# Patient Record
Sex: Female | Born: 1945 | Race: White | Hispanic: No | Marital: Single | Smoking: Never smoker
Health system: Southern US, Community
[De-identification: ages and names within clinical notes are randomized; demographics above are authoritative.]

## PROBLEM LIST (undated history)

## (undated) DIAGNOSIS — N289 Disorder of kidney and ureter, unspecified: Secondary | ICD-10-CM

## (undated) DIAGNOSIS — I10 Essential (primary) hypertension: Secondary | ICD-10-CM

## (undated) DIAGNOSIS — F0390 Unspecified dementia without behavioral disturbance: Secondary | ICD-10-CM

---

## 2005-09-04 ENCOUNTER — Ambulatory Visit: Payer: Self-pay | Admitting: Family Medicine

## 2015-10-07 ENCOUNTER — Encounter (HOSPITAL_COMMUNITY): Payer: Self-pay | Admitting: *Deleted

## 2015-10-07 ENCOUNTER — Inpatient Hospital Stay (HOSPITAL_COMMUNITY)
Admission: EM | Admit: 2015-10-07 | Discharge: 2015-10-15 | DRG: 071 | Disposition: A | Discharge status: Nursing Home (Includes ICF) | Payer: Medicare Other | Attending: Internal Medicine | Admitting: Internal Medicine

## 2015-10-07 ENCOUNTER — Emergency Department (HOSPITAL_COMMUNITY): Payer: Medicare Other

## 2015-10-07 DIAGNOSIS — N179 Acute kidney failure, unspecified: Secondary | ICD-10-CM | POA: Diagnosis present

## 2015-10-07 DIAGNOSIS — R1312 Dysphagia, oropharyngeal phase: Secondary | ICD-10-CM

## 2015-10-07 DIAGNOSIS — I10 Essential (primary) hypertension: Secondary | ICD-10-CM | POA: Diagnosis present

## 2015-10-07 DIAGNOSIS — G9340 Encephalopathy, unspecified: Secondary | ICD-10-CM | POA: Diagnosis not present

## 2015-10-07 DIAGNOSIS — R2689 Other abnormalities of gait and mobility: Secondary | ICD-10-CM

## 2015-10-07 DIAGNOSIS — Z820 Family history of epilepsy and other diseases of the nervous system: Secondary | ICD-10-CM

## 2015-10-07 DIAGNOSIS — L409 Psoriasis, unspecified: Secondary | ICD-10-CM | POA: Diagnosis present

## 2015-10-07 DIAGNOSIS — N184 Chronic kidney disease, stage 4 (severe): Secondary | ICD-10-CM | POA: Diagnosis present

## 2015-10-07 DIAGNOSIS — I129 Hypertensive chronic kidney disease with stage 1 through stage 4 chronic kidney disease, or unspecified chronic kidney disease: Secondary | ICD-10-CM | POA: Diagnosis present

## 2015-10-07 DIAGNOSIS — G9349 Other encephalopathy: Principal | ICD-10-CM | POA: Diagnosis present

## 2015-10-07 DIAGNOSIS — R482 Apraxia: Secondary | ICD-10-CM

## 2015-10-07 DIAGNOSIS — Z66 Do not resuscitate: Secondary | ICD-10-CM | POA: Diagnosis present

## 2015-10-07 DIAGNOSIS — I160 Hypertensive urgency: Secondary | ICD-10-CM | POA: Diagnosis present

## 2015-10-07 DIAGNOSIS — E871 Hypo-osmolality and hyponatremia: Secondary | ICD-10-CM | POA: Diagnosis present

## 2015-10-07 DIAGNOSIS — F0150 Vascular dementia without behavioral disturbance: Secondary | ICD-10-CM | POA: Diagnosis present

## 2015-10-07 DIAGNOSIS — R4182 Altered mental status, unspecified: Secondary | ICD-10-CM | POA: Diagnosis not present

## 2015-10-07 DIAGNOSIS — F0390 Unspecified dementia without behavioral disturbance: Secondary | ICD-10-CM | POA: Diagnosis present

## 2015-10-07 DIAGNOSIS — R7989 Other specified abnormal findings of blood chemistry: Secondary | ICD-10-CM

## 2015-10-07 HISTORY — DX: Unspecified dementia, unspecified severity, without behavioral disturbance, psychotic disturbance, mood disturbance, and anxiety: F03.90

## 2015-10-07 HISTORY — DX: Essential (primary) hypertension: I10

## 2015-10-07 HISTORY — DX: Disorder of kidney and ureter, unspecified: N28.9

## 2015-10-07 LAB — COMPREHENSIVE METABOLIC PANEL
ALBUMIN: 3.5 g/dL (ref 3.5–5.0)
ALK PHOS: 43 U/L (ref 38–126)
ALT: 19 U/L (ref 14–54)
ANION GAP: 10 (ref 5–15)
AST: 19 U/L (ref 15–41)
BILIRUBIN TOTAL: 0.8 mg/dL (ref 0.3–1.2)
BUN: 43 mg/dL — AB (ref 6–20)
CALCIUM: 9.6 mg/dL (ref 8.9–10.3)
CO2: 24 mmol/L (ref 22–32)
Chloride: 99 mmol/L — ABNORMAL LOW (ref 101–111)
Creatinine, Ser: 2.89 mg/dL — ABNORMAL HIGH (ref 0.44–1.00)
GFR calc Af Amer: 18 mL/min — ABNORMAL LOW (ref >60)
GFR calc non Af Amer: 15 mL/min — ABNORMAL LOW (ref >60)
GLUCOSE: 91 mg/dL (ref 65–99)
POTASSIUM: 3.9 mmol/L (ref 3.5–5.1)
SODIUM: 133 mmol/L — AB (ref 135–145)
TOTAL PROTEIN: 6.7 g/dL (ref 6.5–8.1)

## 2015-10-07 LAB — CBC
HEMATOCRIT: 32.1 % — AB (ref 36.0–46.0)
HEMOGLOBIN: 10.9 g/dL — AB (ref 12.0–15.0)
MCH: 29.9 pg (ref 26.0–34.0)
MCHC: 34 g/dL (ref 30.0–36.0)
MCV: 88.2 fL (ref 78.0–100.0)
Platelets: 239 10*3/uL (ref 150–400)
RBC: 3.64 MIL/uL — ABNORMAL LOW (ref 3.87–5.11)
RDW: 13.3 % (ref 11.5–15.5)
WBC: 5.7 10*3/uL (ref 4.0–10.5)

## 2015-10-07 LAB — URINALYSIS, ROUTINE W REFLEX MICROSCOPIC
BILIRUBIN URINE: NEGATIVE
GLUCOSE, UA: NEGATIVE mg/dL
HGB URINE DIPSTICK: NEGATIVE
Ketones, ur: NEGATIVE mg/dL
Leukocytes, UA: NEGATIVE
Nitrite: NEGATIVE
Protein, ur: >300 mg/dL — AB
SPECIFIC GRAVITY, URINE: 1.017 (ref 1.005–1.030)
pH: 6 (ref 5.0–8.0)

## 2015-10-07 LAB — URINE MICROSCOPIC-ADD ON: RBC / HPF: NONE SEEN RBC/hpf (ref 0–5)

## 2015-10-07 LAB — CBG MONITORING, ED: Glucose-Capillary: 85 mg/dL (ref 65–99)

## 2015-10-07 MED ORDER — SODIUM CHLORIDE 0.9 % IV BOLUS (SEPSIS)
1000.0000 mL | Freq: Once | INTRAVENOUS | Status: AC
  Administered 2015-10-07: 1000 mL via INTRAVENOUS

## 2015-10-07 MED ORDER — ORAL CARE MOUTH RINSE
15.0000 mL | Freq: Two times a day (BID) | OROMUCOSAL | Status: DC
  Administered 2015-10-10 – 2015-10-14 (×7): 15 mL via OROMUCOSAL

## 2015-10-07 MED ORDER — HYDRALAZINE HCL 20 MG/ML IJ SOLN: 10.0000 mg | INTRAMUSCULAR | Status: DC | PRN

## 2015-10-07 MED ORDER — ADULT MULTIVITAMIN W/MINERALS CH
1.0000 | ORAL_TABLET | Freq: Every day | ORAL | Status: DC
  Administered 2015-10-08 – 2015-10-15 (×8): 1 via ORAL
  Filled 2015-10-07 (×8): qty 1

## 2015-10-07 MED ORDER — KETOCONAZOLE 2 % EX SHAM
1.0000 "application " | MEDICATED_SHAMPOO | CUTANEOUS | Status: DC
  Administered 2015-10-09 – 2015-10-11 (×2): 1 via TOPICAL
  Filled 2015-10-07: qty 120

## 2015-10-07 MED ORDER — TRIAMCINOLONE ACETONIDE 0.1 % EX CREA
1.0000 "application " | TOPICAL_CREAM | CUTANEOUS | Status: DC
  Administered 2015-10-09 – 2015-10-13 (×2): 1 via TOPICAL
  Filled 2015-10-07: qty 15

## 2015-10-07 MED ORDER — BUPROPION HCL ER (XL) 150 MG PO TB24
150.0000 mg | ORAL_TABLET | Freq: Every day | ORAL | Status: DC
  Administered 2015-10-08 – 2015-10-15 (×8): 150 mg via ORAL
  Filled 2015-10-07 (×8): qty 1

## 2015-10-07 MED ORDER — ASPIRIN 325 MG PO TABS
325.0000 mg | ORAL_TABLET | Freq: Every day | ORAL | Status: DC
  Administered 2015-10-08 – 2015-10-15 (×8): 325 mg via ORAL
  Filled 2015-10-07 (×8): qty 1

## 2015-10-07 MED ORDER — POLYETHYLENE GLYCOL 3350 17 G PO PACK
17.0000 g | PACK | Freq: Every day | ORAL | Status: DC
  Administered 2015-10-08 – 2015-10-15 (×6): 17 g via ORAL
  Filled 2015-10-07 (×8): qty 1

## 2015-10-07 MED ORDER — PERPHENAZINE 4 MG PO TABS
4.0000 mg | ORAL_TABLET | Freq: Two times a day (BID) | ORAL | Status: DC
  Administered 2015-10-08 – 2015-10-15 (×15): 4 mg via ORAL
  Filled 2015-10-07 (×17): qty 1

## 2015-10-07 MED ORDER — HYDRALAZINE HCL 25 MG PO TABS
25.0000 mg | ORAL_TABLET | Freq: Three times a day (TID) | ORAL | Status: DC
  Administered 2015-10-08 (×2): 25 mg via ORAL
  Filled 2015-10-07 (×2): qty 1

## 2015-10-07 MED ORDER — HEPARIN SODIUM (PORCINE) 5000 UNIT/ML IJ SOLN
5000.0000 [IU] | Freq: Three times a day (TID) | INTRAMUSCULAR | Status: DC
  Administered 2015-10-08 – 2015-10-15 (×24): 5000 [IU] via SUBCUTANEOUS
  Filled 2015-10-07 (×21): qty 1

## 2015-10-07 MED ORDER — GUAIFENESIN 100 MG/5ML PO SOLN: 200.0000 mg | Freq: Four times a day (QID) | ORAL | Status: DC | PRN

## 2015-10-07 MED ORDER — ASPIRIN 300 MG RE SUPP
300.0000 mg | Freq: Every day | RECTAL | Status: DC
  Filled 2015-10-07 (×8): qty 1

## 2015-10-07 MED ORDER — CALCIUM CARBONATE-VITAMIN D 500-200 MG-UNIT PO TABS
1.0000 | ORAL_TABLET | Freq: Two times a day (BID) | ORAL | Status: DC
  Administered 2015-10-08 – 2015-10-15 (×16): 1 via ORAL
  Filled 2015-10-07 (×16): qty 1

## 2015-10-07 MED ORDER — SODIUM CHLORIDE 0.9 % IV SOLN
INTRAVENOUS | Status: DC
  Administered 2015-10-08: 75 mL/h via INTRAVENOUS
  Administered 2015-10-08 – 2015-10-09 (×2): via INTRAVENOUS

## 2015-10-07 MED ORDER — CHLORHEXIDINE GLUCONATE 0.12 % MT SOLN
15.0000 mL | Freq: Two times a day (BID) | OROMUCOSAL | Status: DC
  Administered 2015-10-08 – 2015-10-14 (×14): 15 mL via OROMUCOSAL
  Filled 2015-10-07 (×16): qty 15

## 2015-10-07 NOTE — ED Triage Notes (Signed)
EMS reports pt has had AMS for last 3 days [facility] on [address] Nsg wanted to wait to see if she cleared up, she did not, HTN 166/p P 100 CBG 86.

## 2015-10-07 NOTE — ED Provider Notes (Signed)
[facility]-EMERGENCY DEPT Provider Note   CSN: [id number] Arrival date & time: 10/07/15  1746     History   Chief Complaint Chief Complaint  Patient presents with  . Altered Mental Status    HPI Julie Salinas is a 70 y.o. female.  The history is provided by the nursing home. History limited by: patient's speech difficulty.  Altered Mental Status   This is a new problem. Episode onset: 3 days ago. The problem has been gradually worsening. Associated symptoms comments: Difficulty initiating speech or responding to questions, subtle change in activity level and interactiveness at facility. Risk factors: nursing home patient. Her past medical history is significant for hypertension.    Past Medical History:  Diagnosis Date  . Dementia   . Hypertension   . Renal disorder     There are no active problems to display for this patient.   History reviewed. No pertinent surgical history.  OB History    No data available       Home Medications    Prior to Admission medications   Medication Sig Start Date End Date Taking? Authorizing Provider  acetaminophen (TYLENOL) 500 MG tablet Take 500 mg by mouth every 4 (four) hours as needed for mild pain, moderate pain, fever or headache.   Yes Historical Provider, MD  alum & mag hydroxide-simeth (MAALOX/MYLANTA) 200-200-20 MG/5ML suspension Take 30 mLs by mouth every 6 (six) hours as needed for indigestion or heartburn.   Yes Historical Provider, MD  buPROPion (WELLBUTRIN XL) 150 MG 24 hr tablet Take 150 mg by mouth daily.   Yes Historical Provider, MD  calcium-vitamin D (OSCAL WITH D) 500-200 MG-UNIT tablet Take 1 tablet by mouth 2 (two) times daily with a meal.   Yes Historical Provider, MD  guaifenesin (ROBITUSSIN) 100 MG/5ML syrup Take 200 mg by mouth every 6 (six) hours as needed for cough.   Yes Historical Provider, MD  hydrALAZINE (APRESOLINE) 25 MG tablet Take 25 mg by mouth 3 (three) times daily.   Yes Historical Provider, MD    ketoconazole (NIZORAL) 2 % shampoo Apply 1 application topically 2 (two) times a week. Pt applies to scalp on Tuesday and Thursday.   Yes Historical Provider, MD  loperamide (IMODIUM) 2 MG capsule Take 2 mg by mouth as needed for diarrhea or loose stools.   Yes Historical Provider, MD  magnesium hydroxide (MILK OF MAGNESIA) 400 MG/5ML suspension Take 30 mLs by mouth at bedtime as needed for mild constipation.   Yes Historical Provider, MD  Multiple Vitamin (MULTIVITAMIN WITH MINERALS) TABS tablet Take 1 tablet by mouth daily.   Yes Historical Provider, MD  neomycin-bacitracin-polymyxin (NEOSPORIN) ointment Apply 1 application topically as needed for wound care. apply to eye   Yes Historical Provider, MD  perphenazine (TRILAFON) 4 MG tablet Take 4 mg by mouth 2 (two) times daily.   Yes Historical Provider, MD  polyethylene glycol (MIRALAX / GLYCOLAX) packet Take 17 g by mouth daily.   Yes Historical Provider, MD  triamcinolone cream (KENALOG) 0.1 % Apply 1 application topically 2 (two) times a week. Pt applies every Tuesday and Saturday.   Yes Historical Provider, MD    Family History No family history on file.  Social History Social History  Substance Use Topics  . Smoking status: Unknown If Ever Smoked  . Smokeless tobacco: Not on file  . Alcohol use No     Allergies   Review of patient's allergies indicates no known allergies.   Review of Systems Review  of Systems  All other systems reviewed and are negative.    Physical Exam Updated Vital Signs BP (!) 169/102 (BP Location: Right Arm)   Pulse 98   Temp 97.8 F (36.6 C) (Oral)   Resp 18   SpO2 97%   Physical Exam  Constitutional: She is oriented to person, place, and time. She appears well-developed and well-nourished. No distress.  HENT:  Head: Normocephalic.  Eyes: Conjunctivae are normal.  Neck: Neck supple. No tracheal deviation present.  Cardiovascular: Normal rate and regular rhythm.   Pulmonary/Chest: Effort  normal. No respiratory distress.  Abdominal: Soft. She exhibits no distension.  Neurological: She is alert and oriented to person, place, and time. She has normal strength. No cranial nerve deficit.  Speech apraxia, looks at hands when unable to answer questions. Able to state date, month, year without difficulty  Skin: Skin is warm and dry.  Psychiatric: She has a normal mood and affect.     ED Treatments / Results  Labs (all labs ordered are listed, but only abnormal results are displayed) Labs Reviewed  COMPREHENSIVE METABOLIC PANEL - Abnormal; Notable for the following:       Result Value   Sodium 133 (*)    Chloride 99 (*)    BUN 43 (*)    Creatinine, Ser 2.89 (*)    GFR calc non Af Amer 15 (*)    GFR calc Af Amer 18 (*)    All other components within normal limits  CBC - Abnormal; Notable for the following:    RBC 3.64 (*)    Hemoglobin 10.9 (*)    HCT 32.1 (*)    All other components within normal limits  URINALYSIS, ROUTINE W REFLEX MICROSCOPIC (NOT AT [facility]) - Abnormal; Notable for the following:    Protein, ur >300 (*)    All other components within normal limits  URINE MICROSCOPIC-ADD ON - Abnormal; Notable for the following:    Squamous Epithelial / LPF 0-5 (*)    Bacteria, UA RARE (*)    All other components within normal limits  MRSA PCR SCREENING  COMPREHENSIVE METABOLIC PANEL  CBC  CBG MONITORING, ED    EKG  EKG Interpretation None       Radiology Ct Head Wo Contrast  Result Date: 10/07/2015 CLINICAL DATA:  Altered mental status.  Delayed speech. EXAM: CT HEAD WITHOUT CONTRAST TECHNIQUE: Contiguous axial images were obtained from the base of the skull through the vertex without intravenous contrast. COMPARISON:  08/14/2014 FINDINGS: Brain: No evidence of acute infarction, hemorrhage, hydrocephalus, extra-axial collection or mass lesion/mass effect.There is Vascular: No hyperdense vessel or unexpected calcification. Skull: Normal. Negative for fracture  or focal lesion. Sinuses/Orbits: No acute finding. Other: None. IMPRESSION: 1. No acute intracranial abnormalities.  A Electronically Signed   By: Signa Kellaylor  Stroud M.D.   On: 10/07/2015 20:30    Procedures Procedures (including critical care time)  Medications Ordered in ED Medications  buPROPion (WELLBUTRIN XL) 24 hr tablet 150 mg (not administered)  calcium-vitamin D (OSCAL WITH D) 500-200 MG-UNIT per tablet 1 tablet (not administered)  guaiFENesin (ROBITUSSIN) 100 MG/5ML solution 200 mg (not administered)  hydrALAZINE (APRESOLINE) tablet 25 mg (25 mg Oral Given 10/08/15 0020)  ketoconazole (NIZORAL) 2 % shampoo 1 application (not administered)  multivitamin with minerals tablet 1 tablet (not administered)  perphenazine (TRILAFON) tablet 4 mg (4 mg Oral Not Given 10/08/15 0020)  polyethylene glycol (MIRALAX / GLYCOLAX) packet 17 g (not administered)  triamcinolone cream (KENALOG) 0.1 % 1  application (not administered)  hydrALAZINE (APRESOLINE) injection 10 mg (not administered)  0.9 %  sodium chloride infusion ( Intravenous New Bag/Given 10/08/15 0021)  heparin injection 5,000 Units (5,000 Units Subcutaneous Given 10/08/15 0021)  aspirin suppository 300 mg (not administered)    Or  aspirin tablet 325 mg (not administered)  chlorhexidine (PERIDEX) 0.12 % solution 15 mL (15 mLs Mouth Rinse Given 10/08/15 0020)  MEDLINE mouth rinse (not administered)  sodium chloride 0.9 % bolus 1,000 mL (0 mLs Intravenous Stopped 10/07/15 2223)     Initial Impression / Assessment and Plan / ED Course  I have reviewed the triage vital signs and the nursing notes.  Pertinent labs & imaging results that were available during my care of the patient were reviewed by me and considered in my medical decision making (see chart for details).  Clinical Course    70 y.o. female presents with new onset apraxic speech 3 days ago. Has h/o dementia but most of history is provided by facility. Patient is oriented and  can state date and year. When asked a question she stares blankly and is unable to respond but can repeat back the question. Has mild baseline tremor. No significant hematologic or metabolic abnormalities to explain symptoms. Discussed with neurology who recommended MR to evaluate for stroke. Not a candidate for any acute intervention because of length of symptoms. Baseline Cr is 1.8 in 2015 per outside facility who was contacted by phone. Today is 2.9 which may be chronic or acute. Hospitalist was consulted for admission and will see the patient in the emergency department.   Final Clinical Impressions(s) / ED Diagnoses   Final diagnoses:  Speech apraxia  Creatinine elevation    New Prescriptions New Prescriptions   No medications on file     Lyndal Pulley, MD 10/08/15 [phone]

## 2015-10-07 NOTE — ED Notes (Signed)
MD at bedside. 

## 2015-10-07 NOTE — H&P (Signed)
History and Physical    Julie Salinas:[id number] DOB: 1945/12/07 DOA: 10/07/2015  PCP: [facility]  Patient coming from: Nursing home.  Chief Complaint: Mental status changes.  History obtained from ER physician as patient has dementia and no family available at this time.  HPI: Julie Salinas is a 70 y.o. female with the known history of vascular dementia, hypertension, psoriasis, chronic kidney disease stage IV as per the records from the nursing home was brought to the ER after patient had gradually declining mental status over the last 3 days. As per the ER physician and the ER nurse patient was looking confused and not answering questions well but repeating the same question on arrival in the ER for at least 2-3 hours. CT head was unremarkable. ER physician discussed with on-call neurologist who advised to get MRI and if it rules in for stroke then further workup. Patient's creatinine is around 2.8 and ER physician had discussed with the nurse at the nursing home who stated last creatinine was around 1.8. In addition patient's blood pressures also found to be elevated. Patient on my exam has become more well oriented to name and place. Follows commands and moves all extremities. Denies any chest pain or shortness of breath.   ED Course: CT head is unremarkable. Was started on IV fluids.  Review of Systems: As per HPI, rest all negative.   Past Medical History:  Diagnosis Date  . Dementia   . Hypertension   . Renal disorder     History reviewed. No pertinent surgical history.   reports that she has never smoked. She has never used smokeless tobacco. She reports that she does not drink alcohol or use drugs.  No Known Allergies  Family History  Problem Relation Age of Onset  . Parkinson's disease Father     Prior to Admission medications   Medication Sig Start Date End Date Taking? Authorizing Provider  acetaminophen (TYLENOL) 500 MG tablet Take 500 mg by  mouth every 4 (four) hours as needed for mild pain, moderate pain, fever or headache.   Yes Historical Provider, MD  alum & mag hydroxide-simeth (MAALOX/MYLANTA) 200-200-20 MG/5ML suspension Take 30 mLs by mouth every 6 (six) hours as needed for indigestion or heartburn.   Yes Historical Provider, MD  buPROPion (WELLBUTRIN XL) 150 MG 24 hr tablet Take 150 mg by mouth daily.   Yes Historical Provider, MD  calcium-vitamin D (OSCAL WITH D) 500-200 MG-UNIT tablet Take 1 tablet by mouth 2 (two) times daily with a meal.   Yes Historical Provider, MD  guaifenesin (ROBITUSSIN) 100 MG/5ML syrup Take 200 mg by mouth every 6 (six) hours as needed for cough.   Yes Historical Provider, MD  hydrALAZINE (APRESOLINE) 25 MG tablet Take 25 mg by mouth 3 (three) times daily.   Yes Historical Provider, MD  ketoconazole (NIZORAL) 2 % shampoo Apply 1 application topically 2 (two) times a week. Pt applies to scalp on Tuesday and Thursday.   Yes Historical Provider, MD  loperamide (IMODIUM) 2 MG capsule Take 2 mg by mouth as needed for diarrhea or loose stools.   Yes Historical Provider, MD  magnesium hydroxide (MILK OF MAGNESIA) 400 MG/5ML suspension Take 30 mLs by mouth at bedtime as needed for mild constipation.   Yes Historical Provider, MD  Multiple Vitamin (MULTIVITAMIN WITH MINERALS) TABS tablet Take 1 tablet by mouth daily.   Yes Historical Provider, MD  neomycin-bacitracin-polymyxin (NEOSPORIN) ointment Apply 1 application topically as needed for wound  care. apply to eye   Yes Historical Provider, MD  perphenazine (TRILAFON) 4 MG tablet Take 4 mg by mouth 2 (two) times daily.   Yes Historical Provider, MD  polyethylene glycol (MIRALAX / GLYCOLAX) packet Take 17 g by mouth daily.   Yes Historical Provider, MD  triamcinolone cream (KENALOG) 0.1 % Apply 1 application topically 2 (two) times a week. Pt applies every Tuesday and Saturday.   Yes Historical Provider, MD    Physical Exam: Vitals:   10/07/15 2045 10/07/15  2115 10/07/15 2200 10/07/15 2225  BP:   184/78   Pulse: 88 90 90   Resp: 20 19    Temp:    97.8 F (36.6 C)  TempSrc:      SpO2: 95% 98% 96%       Constitutional: Not in distress. Vitals:   10/07/15 2045 10/07/15 2115 10/07/15 2200 10/07/15 2225  BP:   184/78   Pulse: 88 90 90   Resp: 20 19    Temp:    97.8 F (36.6 C)  TempSrc:      SpO2: 95% 98% 96%    Eyes: Anicteric no pallor. ENMT: No discharge from the ears eyes nose or mouth. Neck: No mass felt. No JVD appreciated. Respiratory: No rhonchi or crepitations. Cardiovascular: S1-S2 heard. Abdomen: Soft nontender bowel sounds present. No guarding or rigidity. Musculoskeletal: No edema. Skin: Chronic skin changes. Neurologic: Alert awake oriented to name and place. Moves all extremities 5 x 5 but has generalized weakness. No facial symmetry. Tongue is midline. PERRLA positive. Psychiatric: Has dementia but is oriented to name and place.   Labs on Admission: I have personally reviewed following labs and imaging studies  CBC:  Recent Labs Lab 10/07/15 1840  WBC 5.7  HGB 10.9*  HCT 32.1*  MCV 88.2  PLT 239   Basic Metabolic Panel:  Recent Labs Lab 10/07/15 1840  NA 133*  K 3.9  CL 99*  CO2 24  GLUCOSE 91  BUN 43*  CREATININE 2.89*  CALCIUM 9.6   GFR: CrCl cannot be calculated (Unknown ideal weight.). Liver Function Tests:  Recent Labs Lab 10/07/15 1840  AST 19  ALT 19  ALKPHOS 43  BILITOT 0.8  PROT 6.7  ALBUMIN 3.5   No results for input(s): LIPASE, AMYLASE in the last 168 hours. No results for input(s): AMMONIA in the last 168 hours. Coagulation Profile: No results for input(s): INR, PROTIME in the last 168 hours. Cardiac Enzymes: No results for input(s): CKTOTAL, CKMB, CKMBINDEX, TROPONINI in the last 168 hours. BNP (last 3 results) No results for input(s): PROBNP in the last 8760 hours. HbA1C: No results for input(s): HGBA1C in the last 72 hours. CBG:  Recent Labs Lab  10/07/15 1814  GLUCAP 85   Lipid Profile: No results for input(s): CHOL, HDL, LDLCALC, TRIG, CHOLHDL, LDLDIRECT in the last 72 hours. Thyroid Function Tests: No results for input(s): TSH, T4TOTAL, FREET4, T3FREE, THYROIDAB in the last 72 hours. Anemia Panel: No results for input(s): VITAMINB12, FOLATE, FERRITIN, TIBC, IRON, RETICCTPCT in the last 72 hours. Urine analysis:    Component Value Date/Time   COLORURINE YELLOW 10/07/2015 2015   APPEARANCEUR CLEAR 10/07/2015 2015   LABSPEC 1.017 10/07/2015 2015   PHURINE 6.0 10/07/2015 2015   GLUCOSEU NEGATIVE 10/07/2015 2015   HGBUR NEGATIVE 10/07/2015 2015   BILIRUBINUR NEGATIVE 10/07/2015 2015   KETONESUR NEGATIVE 10/07/2015 2015   PROTEINUR >300 (A) 10/07/2015 2015   NITRITE NEGATIVE 10/07/2015 2015   LEUKOCYTESUR NEGATIVE 10/07/2015 2015  Sepsis Labs: @LABRCNTIP (procalcitonin:4,lacticidven:4) )No results found for this or any previous visit (from the past 240 hour(s)).   Radiological Exams on Admission: Ct Head Wo Contrast  Result Date: 10/07/2015 CLINICAL DATA:  Altered mental status.  Delayed speech. EXAM: CT HEAD WITHOUT CONTRAST TECHNIQUE: Contiguous axial images were obtained from the base of the skull through the vertex without intravenous contrast. COMPARISON:  08/14/2014 FINDINGS: Brain: No evidence of acute infarction, hemorrhage, hydrocephalus, extra-axial collection or mass lesion/mass effect.There is Vascular: No hyperdense vessel or unexpected calcification. Skull: Normal. Negative for fracture or focal lesion. Sinuses/Orbits: No acute finding. Other: None. IMPRESSION: 1. No acute intracranial abnormalities.  A Electronically Signed   By: Signa Kell M.D.   On: 10/07/2015 20:30      Assessment/Plan Principal Problem:   Acute encephalopathy Active Problems:   Apraxia   Hypertension, uncontrolled   CKD (chronic kidney disease) stage 4, GFR 15-29 ml/min (HCC)   Dementia   Encephalopathy acute    1. Acute  encephalopathy - cause not clear. Patient initially was confused in the ER which has improved at this time. Among the differentials is stroke for which MRI brain has been ordered. If patient does rule in for stroke then further workup. Check ammonia levels, TSH, RPR, B12, EEG and folate levels. Continue with gentle hydration. Patient is afebrile and UA is unremarkable. Chest x-ray pending. 2. Hypertension uncontrolled/hypertensive urgency - patient is on hydralazine which will be continued. For now I have placed patient on when necessary IV hydralazine for systolic blood pressure more than 200. If patient is ruled out for stroke then more aggressive control of blood pressure. 3. Chronic kidney disease stage IV as per the charts from the nursing home - as per the ER physician who had discussed with the nursing home creatinine has increased from patient's baseline. Patient is receiving gentle hydration. Follow metabolic panel. UA shows proteinuria. 4. Vascular dementia per the chart - continue present medications.   DVT prophylaxis: Heparin. Code Status: DO NOT RESUSCITATE.  Family Communication: No family at the bedside.  Disposition Plan: Nursing home.  Consults called: None.  Admission status: Observation.    Eduard Clos MD [facility] Pager [phone].  If 7PM-7AM, please contact night-coverage [email or web address] Password [email or web address]  10/07/2015, 10:51 PM

## 2015-10-08 ENCOUNTER — Observation Stay (HOSPITAL_COMMUNITY): Payer: Medicare Other

## 2015-10-08 ENCOUNTER — Observation Stay (HOSPITAL_BASED_OUTPATIENT_CLINIC_OR_DEPARTMENT_OTHER)
Admit: 2015-10-08 | Discharge: 2015-10-08 | Disposition: A | Discharge status: Home / Self Care | Payer: Medicare Other | Attending: Internal Medicine | Admitting: Internal Medicine

## 2015-10-08 DIAGNOSIS — I160 Hypertensive urgency: Secondary | ICD-10-CM | POA: Diagnosis present

## 2015-10-08 DIAGNOSIS — N179 Acute kidney failure, unspecified: Secondary | ICD-10-CM | POA: Diagnosis present

## 2015-10-08 DIAGNOSIS — Z820 Family history of epilepsy and other diseases of the nervous system: Secondary | ICD-10-CM | POA: Diagnosis not present

## 2015-10-08 DIAGNOSIS — Z66 Do not resuscitate: Secondary | ICD-10-CM | POA: Diagnosis present

## 2015-10-08 DIAGNOSIS — F0150 Vascular dementia without behavioral disturbance: Secondary | ICD-10-CM | POA: Diagnosis present

## 2015-10-08 DIAGNOSIS — F0390 Unspecified dementia without behavioral disturbance: Secondary | ICD-10-CM | POA: Diagnosis not present

## 2015-10-08 DIAGNOSIS — I10 Essential (primary) hypertension: Secondary | ICD-10-CM | POA: Diagnosis not present

## 2015-10-08 DIAGNOSIS — G9340 Encephalopathy, unspecified: Secondary | ICD-10-CM | POA: Diagnosis not present

## 2015-10-08 DIAGNOSIS — L409 Psoriasis, unspecified: Secondary | ICD-10-CM | POA: Diagnosis present

## 2015-10-08 DIAGNOSIS — I129 Hypertensive chronic kidney disease with stage 1 through stage 4 chronic kidney disease, or unspecified chronic kidney disease: Secondary | ICD-10-CM | POA: Diagnosis present

## 2015-10-08 DIAGNOSIS — E871 Hypo-osmolality and hyponatremia: Secondary | ICD-10-CM | POA: Diagnosis present

## 2015-10-08 DIAGNOSIS — R4182 Altered mental status, unspecified: Secondary | ICD-10-CM | POA: Diagnosis present

## 2015-10-08 DIAGNOSIS — N184 Chronic kidney disease, stage 4 (severe): Secondary | ICD-10-CM | POA: Diagnosis not present

## 2015-10-08 DIAGNOSIS — G9349 Other encephalopathy: Secondary | ICD-10-CM | POA: Diagnosis present

## 2015-10-08 LAB — CBC
HCT: 31.6 % — ABNORMAL LOW (ref 36.0–46.0)
Hemoglobin: 10.6 g/dL — ABNORMAL LOW (ref 12.0–15.0)
MCH: 29.8 pg (ref 26.0–34.0)
MCHC: 33.5 g/dL (ref 30.0–36.0)
MCV: 88.8 fL (ref 78.0–100.0)
PLATELETS: 213 10*3/uL (ref 150–400)
RBC: 3.56 MIL/uL — ABNORMAL LOW (ref 3.87–5.11)
RDW: 13.4 % (ref 11.5–15.5)
WBC: 4.9 10*3/uL (ref 4.0–10.5)

## 2015-10-08 LAB — COMPREHENSIVE METABOLIC PANEL
ALK PHOS: 40 U/L (ref 38–126)
ALT: 19 U/L (ref 14–54)
AST: 17 U/L (ref 15–41)
Albumin: 3.2 g/dL — ABNORMAL LOW (ref 3.5–5.0)
Anion gap: 9 (ref 5–15)
BUN: 39 mg/dL — AB (ref 6–20)
CALCIUM: 9 mg/dL (ref 8.9–10.3)
CHLORIDE: 104 mmol/L (ref 101–111)
CO2: 25 mmol/L (ref 22–32)
CREATININE: 2.71 mg/dL — AB (ref 0.44–1.00)
GFR calc Af Amer: 19 mL/min — ABNORMAL LOW (ref >60)
GFR calc non Af Amer: 17 mL/min — ABNORMAL LOW (ref >60)
Glucose, Bld: 94 mg/dL (ref 65–99)
Potassium: 3.6 mmol/L (ref 3.5–5.1)
SODIUM: 138 mmol/L (ref 135–145)
Total Bilirubin: 0.7 mg/dL (ref 0.3–1.2)
Total Protein: 5.9 g/dL — ABNORMAL LOW (ref 6.5–8.1)

## 2015-10-08 LAB — MRSA PCR SCREENING: MRSA BY PCR: NEGATIVE

## 2015-10-08 LAB — AMMONIA: Ammonia: <9 umol/L — ABNORMAL LOW (ref 9–35)

## 2015-10-08 LAB — RPR: RPR: NONREACTIVE

## 2015-10-08 LAB — TSH: TSH: 1.557 u[IU]/mL (ref 0.350–4.500)

## 2015-10-08 LAB — VITAMIN B12: VITAMIN B 12: 1103 pg/mL — AB (ref 180–914)

## 2015-10-08 MED ORDER — RESOURCE THICKENUP CLEAR PO POWD
ORAL | Status: DC | PRN
  Filled 2015-10-08: qty 125

## 2015-10-08 MED ORDER — HYDRALAZINE HCL 50 MG PO TABS
50.0000 mg | ORAL_TABLET | Freq: Three times a day (TID) | ORAL | Status: DC
  Administered 2015-10-08 – 2015-10-15 (×21): 50 mg via ORAL
  Filled 2015-10-08 (×21): qty 1

## 2015-10-08 MED ORDER — VITAMINS A & D EX OINT
TOPICAL_OINTMENT | CUTANEOUS | Status: AC
  Administered 2015-10-08: 5
  Filled 2015-10-08: qty 5

## 2015-10-08 NOTE — Progress Notes (Signed)
EEG completed, results pending. 

## 2015-10-08 NOTE — Progress Notes (Signed)
PROGRESS NOTE    Julie Salinas  AVW:[id number] DOB: 04-05-1945 DOA: 10/07/2015 [facility]   Outpatient Specialists:    Brief Narrative:  Julie Salinas is a 70 y.o. female with the known history of vascular dementia, hypertension, psoriasis, chronic kidney disease stage IV as per the records from the nursing home was brought to the ER after patient had gradually declining mental status over the last 3 days. As per the ER physician and the ER nurse patient was looking confused and not answering questions well but repeating the same question on arrival in the ER for at least 2-3 hours. CT head was unremarkable. ER physician discussed with on-call neurologist who advised to get MRI and if it rules in for stroke then further workup. Patient's creatinine is around 2.8 and ER physician had discussed with the nurse at the nursing home who stated last creatinine was around 1.8. In addition patient's blood pressures also found to be elevated. Patient on my exam has become more well oriented to name and place. Follows commands and moves all extremities. Denies any chest pain or shortness of breath.   Assessment & Plan:   Principal Problem:   Acute encephalopathy Active Problems:   Apraxia   Hypertension, uncontrolled   CKD (chronic kidney disease) stage 4, GFR 15-29 ml/min (HCC)   Dementia   Encephalopathy acute   Acute encephalopathy -  -appears to be back to baseline -refusing MRI brain "I do not want to go in that tube" -ammonia levels, TSH, RPR, B12, EEG and folate levels- normal so far  Hypertension uncontrolled/hypertensive urgency - -increase hydralazine for better control  Chronic kidney disease stage IV as per the charts from the nursing home - as per the ER physician who had discussed with the nursing home creatinine has increased from patient's baseline of 1.8ish. Patient is receiving gentle hydration BMP in AM  Vascular dementia per the chart - continue  present medications   DVT prophylaxis:  SQ Heparin  Code Status: DNR   Family Communication: Anne Ngalled Lorraine but no answer  Disposition Plan:  From SNF-- PT consult   Consultants:        Subjective: Appears to be back to baseline -knows who she is, where she is and what year it is  Objective: Vitals:   10/07/15 2200 10/07/15 2225 10/07/15 2319 10/08/15 0530  BP: 184/78  (!) 156/53 (!) 176/87  Pulse: 90  92 91  Resp:   20 20  Temp:  97.8 F (36.6 C) 99 F (37.2 C) 98 F (36.7 C)  TempSrc:   Oral Oral  SpO2: 96%  97% 94%  Weight:   74.6 kg (164 lb 7.4 oz)   Height:   5\' 2"  (1.575 m)     Intake/Output Summary (Last 24 hours) at 10/08/15 1248 Last data filed at 10/08/15 1009  Gross per 24 hour  Intake             1000 ml  Output             1100 ml  Net             -100 ml   Filed Weights   10/07/15 2319  Weight: 74.6 kg (164 lb 7.4 oz)    Examination:  General exam: Appears calm and comfortable  Respiratory system: Clear to auscultation. Respiratory effort normal. Cardiovascular system: S1 & S2 heard, RRR. No JVD, murmurs, rubs, gallops or clicks. No pedal edema. Gastrointestinal system: Abdomen is nondistended,  soft and nontender. No organomegaly or masses felt. Normal bowel sounds heard. Central nervous system: Alert and oriented. No focal neurological deficits.     Data Reviewed: I have personally reviewed following labs and imaging studies  CBC:  Recent Labs Lab 10/07/15 1840 10/08/15 0348  WBC 5.7 4.9  HGB 10.9* 10.6*  HCT 32.1* 31.6*  MCV 88.2 88.8  PLT 239 213   Basic Metabolic Panel:  Recent Labs Lab 10/07/15 1840 10/08/15 0348  NA 133* 138  K 3.9 3.6  CL 99* 104  CO2 24 25  GLUCOSE 91 94  BUN 43* 39*  CREATININE 2.89* 2.71*  CALCIUM 9.6 9.0   GFR: Estimated Creatinine Clearance: 18 mL/min (by C-G formula based on SCr of 2.71 mg/dL). Liver Function Tests:  Recent Labs Lab 10/07/15 1840 10/08/15 0348  AST 19  17  ALT 19 19  ALKPHOS 43 40  BILITOT 0.8 0.7  PROT 6.7 5.9*  ALBUMIN 3.5 3.2*   No results for input(s): LIPASE, AMYLASE in the last 168 hours.  Recent Labs Lab 10/08/15 0826  AMMONIA <9*   Coagulation Profile: No results for input(s): INR, PROTIME in the last 168 hours. Cardiac Enzymes: No results for input(s): CKTOTAL, CKMB, CKMBINDEX, TROPONINI in the last 168 hours. BNP (last 3 results) No results for input(s): PROBNP in the last 8760 hours. HbA1C: No results for input(s): HGBA1C in the last 72 hours. CBG:  Recent Labs Lab 10/07/15 1814  GLUCAP 85   Lipid Profile: No results for input(s): CHOL, HDL, LDLCALC, TRIG, CHOLHDL, LDLDIRECT in the last 72 hours. Thyroid Function Tests:  Recent Labs  10/08/15 0826  TSH 1.557   Anemia Panel:  Recent Labs  10/08/15 0826  VITAMINB12 1,103*   Urine analysis:    Component Value Date/Time   COLORURINE YELLOW 10/07/2015 2015   APPEARANCEUR CLEAR 10/07/2015 2015   LABSPEC 1.017 10/07/2015 2015   PHURINE 6.0 10/07/2015 2015   GLUCOSEU NEGATIVE 10/07/2015 2015   HGBUR NEGATIVE 10/07/2015 2015   BILIRUBINUR NEGATIVE 10/07/2015 2015   KETONESUR NEGATIVE 10/07/2015 2015   PROTEINUR >300 (A) 10/07/2015 2015   NITRITE NEGATIVE 10/07/2015 2015   LEUKOCYTESUR NEGATIVE 10/07/2015 2015      Recent Results (from the past 240 hour(s))  MRSA PCR Screening     Status: None   Collection Time: 10/08/15 12:21 AM  Result Value Ref Range Status   MRSA by PCR NEGATIVE NEGATIVE Final    Comment:        The GeneXpert MRSA Assay (FDA approved for NASAL specimens only), is one component of a comprehensive MRSA colonization surveillance program. It is not intended to diagnose MRSA infection nor to guide or monitor treatment for MRSA infections.       Anti-infectives    None       Radiology Studies: Ct Head Wo Contrast  Result Date: 10/07/2015 CLINICAL DATA:  Altered mental status.  Delayed speech. EXAM: CT HEAD  WITHOUT CONTRAST TECHNIQUE: Contiguous axial images were obtained from the base of the skull through the vertex without intravenous contrast. COMPARISON:  08/14/2014 FINDINGS: Brain: No evidence of acute infarction, hemorrhage, hydrocephalus, extra-axial collection or mass lesion/mass effect.There is Vascular: No hyperdense vessel or unexpected calcification. Skull: Normal. Negative for fracture or focal lesion. Sinuses/Orbits: No acute finding. Other: None. IMPRESSION: 1. No acute intracranial abnormalities.  A Electronically Signed   By: Signa Kell M.D.   On: 10/07/2015 20:30   Dg Chest Port 1 View  Result Date: 10/08/2015 CLINICAL DATA:  70 year old female with shortness of breath. Initial encounter. EXAM: PORTABLE CHEST 1 VIEW COMPARISON:  [facility] Chest radiographs 08/14/2014. FINDINGS: Portable AP upright view at 0754 hours. Improved lung volumes. Mediastinal contours remain normal. Visualized tracheal air column is within normal limits. Mild Pulmonary vascular congestion without overt edema. No pneumothorax, pleural effusion or confluent pulmonary opacity. No acute osseous abnormality identified. IMPRESSION: No acute cardiopulmonary abnormality; mild vascular congestion without overt edema. Electronically Signed   By: Odessa Fleming M.D.   On: 10/08/2015 08:24        Scheduled Meds: . aspirin  300 mg Rectal Daily   Or  . aspirin  325 mg Oral Daily  . buPROPion  150 mg Oral Daily  . calcium-vitamin D  1 tablet Oral BID WC  . chlorhexidine  15 mL Mouth Rinse BID  . heparin  5,000 Units Subcutaneous Q8H  . hydrALAZINE  25 mg Oral TID  . [START ON 10/09/2015] ketoconazole  1 application Topical Once per day on Tue Thu  . mouth rinse  15 mL Mouth Rinse q12n4p  . multivitamin with minerals  1 tablet Oral Daily  . perphenazine  4 mg Oral BID  . polyethylene glycol  17 g Oral Daily  . [START ON 10/09/2015] triamcinolone cream  1 application Topical Once per day on Tue Sat    Continuous Infusions: . sodium chloride 75 mL/hr (10/08/15 1019)     LOS: 0 days    Time spent: 25 min    Kanoe Wanner U Sadie Pickar, DO [facility] Pager [phone]  If 7PM-7AM, please contact night-coverage [email or web address] Password [facility] 10/08/2015, 12:48 PM

## 2015-10-08 NOTE — Care Management Note (Signed)
Case Management Note  Patient Details  Name: Julie LombardDiane P Salinas MRN: [id number] Date of Birth: Feb 09, 1945  Subjective/Objective:  70 y/o f admitted w/Acute Encephalopathy. From ALF-[facility].PT cons-await recc.                  Action/Plan:d/c ALF.   Expected Discharge Date:                  Expected Discharge Plan:  Assisted Living / Rest Home  In-House Referral:  Clinical Social Work  Discharge planning Services  CM Consult  Post Acute Care Choice:    Choice offered to:     DME Arranged:    DME Agency:     HH Arranged:    HH Agency:     Status of Service:  In process, will continue to follow  If discussed at Long Length of Stay Meetings, dates discussed:    Additional Comments:  Lanier ClamMahabir, Valerian Jewel, RN 10/08/2015, 2:05 PM

## 2015-10-08 NOTE — Procedures (Signed)
ELECTROENCEPHALOGRAM REPORT  Date of Study: 10/08/2015  Patient's Name: Julie Salinas MRN: [id number] Date of Birth: 09/21/1945  Referring Provider: Midge MiniumArshad Kakrakandy, MD  Clinical History: 70 y.o. female with the known history of vascular dementia, hypertension, psoriasis, chronic kidney disease stage IV as per the records from the nursing home was brought to the ER after patient had gradually declining mental status over the last 3 days  Medications: Hospital Medications  0.9 % sodium chloride infusion  L1 aspirin suppository 300 mg  L1 aspirin tablet 325 mg   buPROPion (WELLBUTRIN XL) 24 hr tablet 150 mg   calcium-vitamin D (OSCAL WITH D) 500-200 MG-UNIT per tablet 1 tablet   chlorhexidine (PERIDEX) 0.12 % solution 15 mL   guaiFENesin (ROBITUSSIN) 100 MG/5ML solution 200 mg   heparin injection 5,000 Units   hydrALAZINE (APRESOLINE) injection 10 mg   hydrALAZINE (APRESOLINE) tablet 25 mg   ketoconazole (NIZORAL) 2 % shampoo 1 application   MEDLINE mouth rinse   multivitamin with minerals tablet 1 tablet   perphenazine (TRILAFON) tablet 4 mg   polyethylene glycol (MIRALAX / GLYCOLAX) packet 17 g   RESOURCE THICKENUP CLEAR   triamcinolone cream (KENALOG) 0.1 % 1 application  Technical Summary: A multichannel digital EEG recording measured by the international 10-20 system with electrodes applied with paste and impedances below 5000 ohms performed in our laboratory with EKG monitoring in an awake and asleep patient.  Hyperventilation and photic stimulation were not performed.  The digital EEG was referentially recorded, reformatted, and digitally filtered in a variety of bipolar and referential montages for optimal display.    Description: The patient is awake and asleep during the recording.  During maximal wakefulness, there is a symmetric, medium voltage 9 Hz posterior dominant rhythm that attenuates with eye opening.  The record is symmetric.  During drowsiness and sleep,  there is an increase in theta slowing of the background.  Vertex waves and symmetric sleep spindles were seen.  There were no epileptiform discharges or electrographic seizures seen.    EKG lead was unremarkable.  Impression: This awake and asleep EEG is normal.    Clinical Correlation: A normal EEG does not exclude a clinical diagnosis of epilepsy.  If further clinical questions remain, prolonged EEG may be helpful.  Clinical correlation is advised.   Shon MilletAdam Jaffe, DO

## 2015-10-08 NOTE — Care Management Obs Status (Signed)
MEDICARE OBSERVATION STATUS NOTIFICATION   Patient Details  Name: Julie Salinas MRN: [id number] Date of Birth: [birth year]   Medicare Observation Status Notification Given:  Yes    Lanier ClamMahabir, Dorothyann Mourer, RN 10/08/2015, 2:04 PM

## 2015-10-08 NOTE — Evaluation (Signed)
Clinical/Bedside Swallow Evaluation Patient Details  Name: Julie Salinas MRN: [id number] Date of Birth: [birth year]  Today's Date: 10/08/2015 Time: SLP Start Time (ACUTE ONLY): [id number] SLP Stop Time (ACUTE ONLY): 0954 SLP Time Calculation (min) (ACUTE ONLY): 31 min  Past Medical History:  Past Medical History:  Diagnosis Date  . Dementia   . Hypertension   . Renal disorder    Past Surgical History: History reviewed. No pertinent surgical history. HPI:  70 yo [facility].  PMH + for Dementia, CKD IV, psoriasis.  Her CT head negative, MRI pending, CXR negative.  Pt for swallow evaluation.  She appears to have tremorous movement of her hands that she states is old.  Pt refused MRI.     Assessment / Plan / Recommendation Clinical Impression  Pt presents with mild suspected oral deficits likely resulting in premature spillage of thin liquid into pharynx/larynx with suspected aspiration.    Overt coughing/face turning red noted with water via straw.  Pt admits to coughing with thin liquids for a long time including with meals, with medicine and when drinking alone.  She also endorses poor tolerance of meat/bread and rice.  CN exam unremarkable and pt CT head, CXR negative.    Pt with good tolerance of ice, nectar, applesauce and cracker with timely swallow, clear voice and no indication of airway compromise.  SLP educated pt to findings and reviewed dysphagia mitigation strategies verbally and in writing.  Recommend dys3/puree meat/nectar/ice diet with precautions.  SLP hopeful to advance diet clinically.      Aspiration Risk  Mild aspiration risk    Diet Recommendation Dysphagia 3 (Mech soft);Nectar-thick liquid;Ice chips PRN after oral care   Liquid Administration via: Cup;No straw Medication Administration: Whole meds with puree Supervision: Patient able to self feed Compensations: Slow rate;Small sips/bites    Other  Recommendations Oral Care Recommendations: Oral  care BID Other Recommendations: Order thickener from pharmacy   Follow up Recommendations       Frequency and Duration min 1 x/week  1 week       Prognosis Prognosis for Safe Diet Advancement: Good Barriers to Reach Goals: Cognitive deficits      Swallow Study   General Date of Onset: 10/08/15 HPI: 70 [facility].  PMH + for Dementia, CKD IV, psoriasis.  Her CT head negative, MRI pending, CXR negative.  Pt for swallow evaluation.  She appears to have tremorous movement of her hands that she states is old.  Pt refused MRI.   Type of Study: Bedside Swallow Evaluation Diet Prior to this Study: NPO Temperature Spikes Noted: No Respiratory Status: Room air History of Recent Intubation: No Behavior/Cognition: Alert;Cooperative;Pleasant mood Oral Cavity Assessment: Within Functional Limits Oral Care Completed by SLP: No Oral Cavity - Dentition: Other (Comment);Edentulous (few lower teeth only) Vision: Functional for self-feeding Self-Feeding Abilities: Able to feed self;Needs assist (had to use left hand due to IV in right hand) Patient Positioning: Upright in bed Baseline Vocal Quality: Normal Volitional Cough: Strong Volitional Swallow: Able to elicit    Oral/Motor/Sensory Function Overall Oral Motor/Sensory Function: Within functional limits   Ice Chips Ice chips: Within functional limits Presentation: Spoon   Thin Liquid Thin Liquid: Impaired Presentation: Straw;Self Fed Oral Phase Impairments: Reduced lingual movement/coordination Pharyngeal  Phase Impairments: Cough - Immediate    Nectar Thick Nectar Thick Liquid: Within functional limits Presentation: Self Fed;Cup;Straw   Honey Thick Honey Thick Liquid: Not tested  Puree Puree: Within functional limits Presentation: Self Fed;Spoon   Solid   GO   Solid: Within functional limits Presentation: Self Fed;Spoon    Functional Assessment Tool Used: clinical judgement Functional Limitations:  Swallowing Swallow Current Status (Z6109): At least 1 percent but less than 20 percent impaired, limited or restricted Swallow Goal Status [phone]): At least 1 percent but less than 20 percent impaired, limited or restricted   Mills Koller, MS [facility] SLP [phone]

## 2015-10-09 DIAGNOSIS — N184 Chronic kidney disease, stage 4 (severe): Secondary | ICD-10-CM

## 2015-10-09 LAB — CBC
HEMATOCRIT: 31.4 % — AB (ref 36.0–46.0)
HEMOGLOBIN: 10.7 g/dL — AB (ref 12.0–15.0)
MCH: 29.6 pg (ref 26.0–34.0)
MCHC: 34.1 g/dL (ref 30.0–36.0)
MCV: 87 fL (ref 78.0–100.0)
Platelets: 212 10*3/uL (ref 150–400)
RBC: 3.61 MIL/uL — AB (ref 3.87–5.11)
RDW: 13.2 % (ref 11.5–15.5)
WBC: 4.2 10*3/uL (ref 4.0–10.5)

## 2015-10-09 LAB — FOLATE RBC
FOLATE, RBC: 1880 ng/mL (ref >498)
Folate, Hemolysate: 573.3 ng/mL
HEMATOCRIT: 30.5 % — AB (ref 34.0–46.6)

## 2015-10-09 LAB — BASIC METABOLIC PANEL
ANION GAP: 7 (ref 5–15)
BUN: 31 mg/dL — ABNORMAL HIGH (ref 6–20)
CALCIUM: 8.8 mg/dL — AB (ref 8.9–10.3)
CO2: 25 mmol/L (ref 22–32)
Chloride: 104 mmol/L (ref 101–111)
Creatinine, Ser: 2.52 mg/dL — ABNORMAL HIGH (ref 0.44–1.00)
GFR, EST AFRICAN AMERICAN: 21 mL/min — AB (ref >60)
GFR, EST NON AFRICAN AMERICAN: 18 mL/min — AB (ref >60)
GLUCOSE: 130 mg/dL — AB (ref 65–99)
POTASSIUM: 4 mmol/L (ref 3.5–5.1)
Sodium: 136 mmol/L (ref 135–145)

## 2015-10-09 MED ORDER — GLUCERNA SHAKE PO LIQD
237.0000 mL | Freq: Three times a day (TID) | ORAL | Status: DC
  Administered 2015-10-09 – 2015-10-14 (×13): 237 mL via ORAL
  Filled 2015-10-09 (×21): qty 237

## 2015-10-09 NOTE — Progress Notes (Signed)
Speech Language Pathology Treatment: Dysphagia  Patient Details Name: Julie LombardDiane P Salinas MRN: [id number] DOB: 11-26-1945 Today's Date: 10/09/2015 Time: [id number] SLP Time Calculation (min) (ACUTE ONLY): 25 min  Assessment / Plan / Recommendation Clinical Impression  Pt reports good tolerance of liquids that are thicker but admits to coughing/choking on french toast.  She advised SLP yesterday that she does not tolerate breads/regular meats or rice well.  Pt states her meat was chopped yesterday and she could not eat it.  SLP phoned service response - spoke to [place]eline - and advised to need to be compliant with MD orders.  She advised she would inform kitchen.    Pt observed drinking ginger-ale and water thickened to nectar consistency with great tolerance- no indication of airway compromise or residuals.  Pt reports being quite satisfied with thicker drinks vs thin.    Spoke to pt re: possible concerns for ALF accepting pt back on modified diet and pt ability to thicken her own drinks.  Pt reports desire to return to her ALF and is comfortable thickening her own drinks.  Chronic nature of deficits and pt not having recurrent pneumonias bodes well for possible dietary advancement in future.  Recommend brief follow up with SLP for dysphagia management to assure tolerance of least restrictive diet.     HPI HPI: 70 [facility].  PMH + for Dementia, CKD IV, psoriasis.  Her CT head negative, MRI pending, CXR negative.  Pt for swallow evaluation.  She appears to have tremorous movement of her hands that she states is old.  Pt refused MRI.        SLP Plan  Continue with current plan of care     Recommendations  Diet recommendations: Dysphagia 3 (mechanical soft);Nectar-thick liquid;Other(comment) (ice chips) Liquids provided via: Cup Medication Administration: Whole meds with puree Supervision: Patient able to self feed Compensations: Slow rate;Small sips/bites Postural  Changes and/or Swallow Maneuvers: Seated upright 90 degrees;Upright 30-60 min after meal             Oral Care Recommendations: Oral care BID Follow up Recommendations: Home health SLP Plan: Continue with current plan of care     GO                Donavan Burnetamara Valrie Jia, MS [facility] SLP [phone]

## 2015-10-09 NOTE — Progress Notes (Signed)
PROGRESS NOTE    Julie Salinas  ZOX:[id number] DOB: 1946/01/22 DOA: 10/07/2015 PCP: [facility]   Outpatient Specialists:    Brief Narrative:  Julie Salinas is a 70 y.o. female with the known history of vascular dementia, hypertension, psoriasis, chronic kidney disease stage IV as per the records from the nursing home was brought to the ER after patient had gradually declining mental status over the last 3 days. As per the ER physician and the ER nurse patient was looking confused and not answering questions well but repeating the same question on arrival in the ER for at least 2-3 hours. CT head was unremarkable. ER physician discussed with on-call neurologist who advised to get MRI and if it rules in for stroke then further workup. Patient's creatinine is around 2.8 and ER physician had discussed with the nurse at the nursing home who stated last creatinine was around 1.8. In addition patient's blood pressures also found to be elevated. Patient on my exam has become more well oriented to name and place. Follows commands and moves all extremities. Denies any chest pain or shortness of breath.   Assessment & Plan:   Principal Problem:   Acute encephalopathy Active Problems:   Apraxia   Hypertension, uncontrolled   CKD (chronic kidney disease) stage 4, GFR 15-29 ml/min (HCC)   Dementia   Encephalopathy acute   Acute encephalopathy -  -appears to be back to baseline -refusing MRI brain "I do not want to go in that tube" -ammonia levels, TSH, RPR, B12, EEG and folate levels- normal  Hypertension uncontrolled/hypertensive urgency - -increase hydralazine for better control  Chronic kidney disease stage IV as per the charts from the nursing home - as per the ER physician who had discussed with the nursing home creatinine has increased from patient's baseline of 1.8ish. Patient is receiving gentle hydration BMP in AM again  Vascular dementia per the chart - continue  present medications   DVT prophylaxis:  SQ Heparin  Code Status: DNR   Family Communication: Anne Ng but no answer  Disposition Plan:  From ALF?-- PT consult- suspect will need higher level of care   Consultants:        Subjective: says "Hi, Dr. Benjamine Mola" as soon as I enter the room" Says she does not eat/drink at ALF as is she is incontinent  Objective: Vitals:   10/08/15 0530 10/08/15 1245 10/08/15 2201 10/09/15 0405  BP: (!) 176/87 (!) 146/79 (!) 156/72 (!) 162/72  Pulse: 91 89 90 86  Resp: 20 18 16 16   Temp: 98 F (36.7 C) 98.7 F (37.1 C) 98 F (36.7 C) 99.2 F (37.3 C)  TempSrc: Oral Oral Oral Oral  SpO2: 94% 95% 94% 98%  Weight:      Height:        Intake/Output Summary (Last 24 hours) at 10/09/15 1015 Last data filed at 10/09/15 1005  Gross per 24 hour  Intake          1906.25 ml  Output             2000 ml  Net           -93.75 ml   Filed Weights   10/07/15 2319  Weight: 74.6 kg (164 lb 7.4 oz)    Examination:  General exam: Appears calm and comfortable  Respiratory system: Clear to auscultation. Respiratory effort normal. Cardiovascular system: S1 & S2 heard, RRR. No JVD, murmurs, rubs, gallops or clicks. No pedal edema. Gastrointestinal system:  Abdomen is nondistended, soft and nontender. No organomegaly or masses felt. Normal bowel sounds heard. Central nervous system: Alert and oriented. No focal neurological deficits.     Data Reviewed: I have personally reviewed following labs and imaging studies  CBC:  Recent Labs Lab 10/07/15 1840 10/08/15 0348 10/09/15 0838  WBC 5.7 4.9 4.2  HGB 10.9* 10.6* 10.7*  HCT 32.1* 31.6* 31.4*  MCV 88.2 88.8 87.0  PLT 239 213 212   Basic Metabolic Panel:  Recent Labs Lab 10/07/15 1840 10/08/15 0348 10/09/15 0838  NA 133* 138 136  K 3.9 3.6 4.0  CL 99* 104 104  CO2 24 25 25   GLUCOSE 91 94 130*  BUN 43* 39* 31*  CREATININE 2.89* 2.71* 2.52*  CALCIUM 9.6 9.0 8.8*    GFR: Estimated Creatinine Clearance: 19.4 mL/min (by C-G formula based on SCr of 2.52 mg/dL). Liver Function Tests:  Recent Labs Lab 10/07/15 1840 10/08/15 0348  AST 19 17  ALT 19 19  ALKPHOS 43 40  BILITOT 0.8 0.7  PROT 6.7 5.9*  ALBUMIN 3.5 3.2*   No results for input(s): LIPASE, AMYLASE in the last 168 hours.  Recent Labs Lab 10/08/15 0826  AMMONIA <9*   Coagulation Profile: No results for input(s): INR, PROTIME in the last 168 hours. Cardiac Enzymes: No results for input(s): CKTOTAL, CKMB, CKMBINDEX, TROPONINI in the last 168 hours. BNP (last 3 results) No results for input(s): PROBNP in the last 8760 hours. HbA1C: No results for input(s): HGBA1C in the last 72 hours. CBG:  Recent Labs Lab 10/07/15 1814  GLUCAP 85   Lipid Profile: No results for input(s): CHOL, HDL, LDLCALC, TRIG, CHOLHDL, LDLDIRECT in the last 72 hours. Thyroid Function Tests:  Recent Labs  10/08/15 0826  TSH 1.557   Anemia Panel:  Recent Labs  10/08/15 0826  VITAMINB12 1,103*   Urine analysis:    Component Value Date/Time   COLORURINE YELLOW 10/07/2015 2015   APPEARANCEUR CLEAR 10/07/2015 2015   LABSPEC 1.017 10/07/2015 2015   PHURINE 6.0 10/07/2015 2015   GLUCOSEU NEGATIVE 10/07/2015 2015   HGBUR NEGATIVE 10/07/2015 2015   BILIRUBINUR NEGATIVE 10/07/2015 2015   KETONESUR NEGATIVE 10/07/2015 2015   PROTEINUR >300 (A) 10/07/2015 2015   NITRITE NEGATIVE 10/07/2015 2015   LEUKOCYTESUR NEGATIVE 10/07/2015 2015      Recent Results (from the past 240 hour(s))  MRSA PCR Screening     Status: None   Collection Time: 10/08/15 12:21 AM  Result Value Ref Range Status   MRSA by PCR NEGATIVE NEGATIVE Final    Comment:        The GeneXpert MRSA Assay (FDA approved for NASAL specimens only), is one component of a comprehensive MRSA colonization surveillance program. It is not intended to diagnose MRSA infection nor to guide or monitor treatment for MRSA infections.        Anti-infectives    None       Radiology Studies: Ct Head Wo Contrast  Result Date: 10/07/2015 CLINICAL DATA:  Altered mental status.  Delayed speech. EXAM: CT HEAD WITHOUT CONTRAST TECHNIQUE: Contiguous axial images were obtained from the base of the skull through the vertex without intravenous contrast. COMPARISON:  08/14/2014 FINDINGS: Brain: No evidence of acute infarction, hemorrhage, hydrocephalus, extra-axial collection or mass lesion/mass effect.There is Vascular: No hyperdense vessel or unexpected calcification. Skull: Normal. Negative for fracture or focal lesion. Sinuses/Orbits: No acute finding. Other: None. IMPRESSION: 1. No acute intracranial abnormalities.  A Electronically Signed   By: Signa Kell  M.D.   On: 10/07/2015 20:30   Dg Chest Port 1 View  Result Date: 10/08/2015 CLINICAL DATA:  70 year old female with shortness of breath. Initial encounter. EXAM: PORTABLE CHEST 1 VIEW COMPARISON:  [facility] Chest [facility] 08/14/2014. FINDINGS: Portable AP upright view at 0754 hours. Improved lung volumes. Mediastinal contours remain normal. Visualized tracheal air column is within normal limits. Mild Pulmonary vascular congestion without overt edema. No pneumothorax, pleural effusion or confluent pulmonary opacity. No acute osseous abnormality identified. IMPRESSION: No acute cardiopulmonary abnormality; mild vascular congestion without overt edema. Electronically Signed   By: Odessa FlemingH  Hall M.D.   On: 10/08/2015 08:24        Scheduled Meds: . aspirin  300 mg Rectal Daily   Or  . aspirin  325 mg Oral Daily  . buPROPion  150 mg Oral Daily  . calcium-vitamin D  1 tablet Oral BID WC  . chlorhexidine  15 mL Mouth Rinse BID  . feeding supplement (GLUCERNA SHAKE)  237 mL Oral TID BM  . heparin  5,000 Units Subcutaneous Q8H  . hydrALAZINE  50 mg Oral TID  . ketoconazole  1 application Topical Once per day on Tue Thu  . mouth rinse  15 mL Mouth Rinse q12n4p  .  multivitamin with minerals  1 tablet Oral Daily  . perphenazine  4 mg Oral BID  . polyethylene glycol  17 g Oral Daily  . triamcinolone cream  1 application Topical Once per day on Tue Sat   Continuous Infusions:     LOS: 1 day    Time spent: 25 min    Leomia Blake Juanetta GoslingU Munirah Doerner, DO [facility] Pager [phone]  If 7PM-7AM, please contact night-coverage [email or web address] Password [email or web address] 10/09/2015, 10:15 AM

## 2015-10-09 NOTE — Evaluation (Signed)
Physical Therapy Evaluation Patient Details Name: Julie Salinas MRN: [id number] DOB: September 25, 1945 Today's Date: 10/09/2015   History of Present Illness  Pt is a 70 y.o. female with the known history of vascular dementia, hypertension, psoriasis, chronic kidney disease stage IV and admitted for acute encephalopathy from assisted living.    Clinical Impression  Pt currently with functional limitations due to deficits listed below (see PT Problem List). Pt tolerated treatment well today and seems to be at baseline. Pt was able to walk in hallway and perform all bed mobility with min guard with exception of supine to sit with min assist. Unsure of how much assistance pt will receive at assisted living facility (discharge venue), but would recommend supervision for all mobility and when OOB for safety.    Follow Up Recommendations No PT follow up;Supervision for mobility/OOB    Equipment Recommendations  None recommended by PT    Recommendations for Other Services       Precautions / Restrictions Precautions Precautions: Fall Restrictions Weight Bearing Restrictions: No      Mobility  Bed Mobility Overal bed mobility: Needs Assistance Bed Mobility: Supine to Sit     Supine to sit: Min assist;HOB elevated     General bed mobility comments: assistance required to help pt scoot to EOB  Transfers Overall transfer level: Needs assistance Equipment used: None Transfers: Sit to/from Stand Sit to Stand: Min guard         General transfer comment: guarding for safety  Ambulation/Gait Ambulation/Gait assistance: Min guard Ambulation Distance (Feet): 340 Feet Assistive device: None Gait Pattern/deviations: Step-through pattern (decreased bilateral arm swing)     General Gait Details: required guarding for safety  Stairs            Wheelchair Mobility    Modified Rankin (Stroke Patients Only)       Balance                                              Pertinent Vitals/Pain Pain Assessment: No/denies pain    Home Living Family/patient expects to be discharged to:: Assisted living                      Prior Function Level of Independence: Independent         Comments: Pt reports ambulating independently without an AD prior to hospitalization; pt states walking to dining hall at assisted living facility regularly     Hand Dominance        Extremity/Trunk Assessment               Lower Extremity Assessment: Overall WFL for tasks assessed (strength not formally assessed; pt WFL for all mobility)         Communication      Cognition Arousal/Alertness: Awake/alert Behavior During Therapy: WFL for tasks assessed/performed Overall Cognitive Status: History of cognitive impairments - at baseline                      General Comments      Exercises        Assessment/Plan    PT Assessment Patient needs continued PT services  PT Diagnosis Generalized weakness   PT Problem List Decreased mobility;Decreased strength;Decreased safety awareness  PT Treatment Interventions Gait training;Functional mobility training;Therapeutic activities;Therapeutic exercise;Patient/family education   PT Goals (Current goals can  be found in the Care Plan section) Acute Rehab PT Goals PT Goal Formulation: With patient Time For Goal Achievement: 10/16/15 Potential to Achieve Goals: Good    Frequency Min 3X/week   Barriers to discharge        Co-evaluation               End of Session   Activity Tolerance: Patient tolerated treatment well Patient left: in chair;with call bell/phone within reach;with chair alarm set           Time: 1433-1450 PT Time Calculation (min) (ACUTE ONLY): 17 min   Charges:   PT Evaluation $PT Eval Low Complexity: 1 Procedure     PT G CodesKaralee Height:        Daneka Lantigua 10/09/2015, 3:57 PM Karalee Heightachel Walfred Bettendorf, SPT

## 2015-10-10 DIAGNOSIS — G9340 Encephalopathy, unspecified: Secondary | ICD-10-CM

## 2015-10-10 LAB — BASIC METABOLIC PANEL
Anion gap: 7 (ref 5–15)
BUN: 31 mg/dL — AB (ref 6–20)
CHLORIDE: 101 mmol/L (ref 101–111)
CO2: 26 mmol/L (ref 22–32)
CREATININE: 2.42 mg/dL — AB (ref 0.44–1.00)
Calcium: 8.8 mg/dL — ABNORMAL LOW (ref 8.9–10.3)
GFR calc Af Amer: 22 mL/min — ABNORMAL LOW (ref >60)
GFR calc non Af Amer: 19 mL/min — ABNORMAL LOW (ref >60)
GLUCOSE: 102 mg/dL — AB (ref 65–99)
POTASSIUM: 4.8 mmol/L (ref 3.5–5.1)
SODIUM: 134 mmol/L — AB (ref 135–145)

## 2015-10-10 NOTE — NC FL2 (Signed)
[place] MEDICAID FL2 LEVEL OF CARE SCREENING TOOL     IDENTIFICATION  Patient Name: Julie Salinas Birthdate: Jun 02, 1945 Sex: female Admission Date (Current Location): 10/07/2015  [place] [id number] P Facility and Address:  [facility],  [address], [place] [id number]      Provider Number: [id number]  Attending Physician Name and Address:  Dorothea OgleIskra M Myers, MD  Relative Name and Phone Number:       Current Level of Care: Hospital Recommended Level of Care: Assisted Living Facility Prior Approval Number:    Date Approved/Denied:   PASRR Number: [id number]  Discharge Plan:      Current Diagnoses: Patient Active Problem List   Diagnosis Date Noted  . 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 Acute encephalopathy 10/07/2015  . Hypertension, uncontrolled 10/07/2015  . CKD (chronic kidney disease) stage 4, GFR 15-29 ml/min (HCC) 10/07/2015  . Dementia 10/07/2015  . Encephalopathy acute 10/07/2015    Orientation RESPIRATION BLADDER Height & Weight     Self, Time, Place  Normal Continent Weight: 164 lb 7.4 oz (74.6 kg) Height:  5\' 2"  (157.5 cm)  BEHAVIORAL SYMPTOMS/MOOD NEUROLOGICAL BOWEL NUTRITION STATUS      Continent Diet (Dysphasia 3 Diet)  AMBULATORY STATUS COMMUNICATION OF NEEDS Skin   Supervision Verbally Normal                       Personal Care Assistance Level of Assistance  Bathing, Dressing Bathing Assistance: Limited assistance   Dressing Assistance: Limited assistance     Functional Limitations Info             SPECIAL CARE FACTORS FREQUENCY                       Contractures      Additional Factors Info  Code Status, Allergies Code Status Info: DNR Allergies Info: NKDA           Current Medications (10/10/2015):  This is the current hospital active medication list Current Facility-Administered Medications  Medication Dose Route Frequency Provider Last Rate Last Dose  . aspirin suppository  300 mg  300 mg Rectal Daily Eduard ClosArshad N Kakrakandy, MD       Or  . aspirin tablet 325 mg  325 mg Oral Daily Eduard ClosArshad N Kakrakandy, MD   325 mg at 10/10/15 0908  . buPROPion (WELLBUTRIN XL) 24 hr tablet 150 mg  150 mg Oral Daily Eduard ClosArshad N Kakrakandy, MD   150 mg at 10/10/15 0908  . calcium-vitamin D (OSCAL WITH D) 500-200 MG-UNIT per tablet 1 tablet  1 tablet Oral BID WC Eduard ClosArshad N Kakrakandy, MD   1 tablet at 10/10/15 0749  . chlorhexidine (PERIDEX) 0.12 % solution 15 mL  15 mL Mouth Rinse BID Eduard ClosArshad N Kakrakandy, MD   15 mL at 10/10/15 0908  . feeding supplement (GLUCERNA SHAKE) (GLUCERNA SHAKE) liquid 237 mL  237 mL Oral TID BM Jessica U Vann, DO   237 mL at 10/10/15 1000  . guaiFENesin (ROBITUSSIN) 100 MG/5ML solution 200 mg  200 mg Oral Q6H PRN Eduard ClosArshad N Kakrakandy, MD      . heparin injection 5,000 Units  5,000 Units Subcutaneous Q8H Eduard ClosArshad N Kakrakandy, MD   5,000 Units at 10/10/15 0502  . hydrALAZINE (APRESOLINE) injection 10 mg  10 mg Intravenous Q4H PRN Eduard ClosArshad N Kakrakandy, MD      . hydrALAZINE (APRESOLINE) tablet 50 mg  50 mg Oral TID Joseph ArtJessica U Vann,  DO   50 mg at 10/10/15 0908  . ketoconazole (NIZORAL) 2 % shampoo 1 application  1 application Topical Once per day on Tue Thu Eduard Clos, MD   1 application at 10/09/15 1042  . MEDLINE mouth rinse  15 mL Mouth Rinse q12n4p Eduard Clos, MD   15 mL at 10/10/15 1157  . multivitamin with minerals tablet 1 tablet  1 tablet Oral Daily Eduard Clos, MD   1 tablet at 10/10/15 0908  . perphenazine (TRILAFON) tablet 4 mg  4 mg Oral BID Eduard Clos, MD   4 mg at 10/10/15 0908  . polyethylene glycol (MIRALAX / GLYCOLAX) packet 17 g  17 g Oral Daily Eduard Clos, MD   17 g at 10/10/15 0908  . RESOURCE THICKENUP CLEAR   Oral PRN Joseph Art, DO      . triamcinolone cream (KENALOG) 0.1 % 1 application  1 application Topical Once per day on Tue Sat Eduard Clos, MD   1 application at 10/09/15 1042     Discharge  Medications: Please see discharge summary for a list of discharge medications.  Relevant Imaging Results:  Relevant Lab Results:   Additional Information SSN: [id number]  Arlyss Repress, LCSW

## 2015-10-10 NOTE — Clinical Social Work Note (Signed)
Clinical Social Work Assessment  Patient Details  Name: Julie Salinas MRN: [id number] Date of Birth: [birth year]  Date of referral:  10/10/15               Reason for consult:  Facility Placement                Permission sought to share information with:  Facility Industrial/product designerContact Representative Permission granted to share information::  Yes, Verbal Permission Granted  Name::        Agency::     Relationship::     Contact Information:     Housing/Transportation Living arrangements for the past 2 months:  Assisted Living Facility Source of Information:  Other (Comment Required) (patient's sister, Julie Salinas) Patient Interpreter Needed:  None Criminal Activity/Legal Involvement Pertinent to Current Situation/Hospitalization:  No - Comment as needed Significant Relationships:  Siblings Lives with:  Facility Resident Do you feel safe going back to the place where you live?  Yes Need for family participation in patient care:  Yes (Comment)  Care giving concerns:  CSW received consult that patient [facility].    Social Worker assessment / plan:  CSW confirmed with patient's sister, Julie Salinas (ph#: [phone]) that [facility] to [facility]. PT worked with patient & recommended no PT follow-up. CSW also confirmed with [facility] that they should be able to take patient back when ready, pending clinical review.   Employment status:  Retired Health and safety inspectornsurance information:  Armed forces operational officerMedicare, [place] Recommend[place]:  No Follow Up Information / Referral to community resources:     Patient/Family's Response to care:  Patient's sister states that she has been pleased with [facility] & plans to provide transportation back to ALF when ready.   Patient/Family's Understanding of and Emotional Response to Diagnosis, Current Treatment, and Prognosis:    Emotional Assessment Appearance:  Appears stated age Attitude/Demeanor/Rapport:     Affect (typically observed):    Orientation:  Oriented to Self, Oriented to  Time, Oriented to Place Alcohol / Substance use:    Psych involvement (Current and /or in the community):     Discharge Needs  Concerns to be addressed:    Readmission within the last 30 days:    Current discharge risk:    Barriers to Discharge:      Arlyss RepressHarrison, Burkley Dech F, LCSW 10/10/2015, 12:36 PM

## 2015-10-10 NOTE — Progress Notes (Signed)
PROGRESS NOTE    Danell P Bramblett  ZOX:[id number] DOB: Julie Lombard1947-02-10 DOA: 10/07/2015 [facility]   Brief Narrative:  70 y.o. female with the known history of vascular dementia, hypertension, psoriasis, chronic kidney disease stage IV as per the records from the nursing home, was brought to the ER after patient had gradually declining mental status over the last 3 days prior to this admission. As per the ER physician and the ER nurse, patient was confused and was not answering questions, repeating the same question on arrival in the ER for at least 2-3 hours. CT head was unremarkable. ER physician discussed with on-call neurologist who advised to get MRI and if it rules in for stroke then further workup. Patient's creatinine is around 2.8 and ER physician had discussed with the nurse at the nursing home who stated last creatinine was ~ 1.8.   Assessment & Plan:  Acute encephalopathy - appears to be back to baseline - continues to decline MRI  - ammonia levels, TSH, RPR, B12, EEG and folate levels- normal  Hypertension uncontrolled/hypertensive urgency - currently on hydralazine 50 mg TID - will add norvasc as well  Chronic kidney disease stage IV  - as per the charts from the nursing home  - pt was started on IVF and Cr is slowly trending down - would like to see more improvement in Cr prior to discharge   Vascular dementia - appears to be at baselien    DVT prophylaxis:  SQ Heparin  Code Status: DNR  Family Communication: No family at bedside   Disposition Plan:  From ALF?-- PT consult- suspect will need higher level of care  Consultants:  None  Subjective: Pt knew I was coming this AM as say : Are you Dr. Izola PriceMyers?" very pleasant and denies concerns this AM, says her appetite is still not great.   Objective: Vitals:   10/09/15 1433 10/09/15 2056 10/10/15 0432 10/10/15 0712  BP: (!) 138/58 (!) 149/67 (!) 157/64 (!) 178/76  Pulse: 92 88 86   Resp: 20 16 18     Temp: 98.8 F (37.1 C) 99.1 F (37.3 C) 98.2 F (36.8 C)   TempSrc: Oral Oral Oral   SpO2: 96% 96% 96%   Weight:      Height:        Intake/Output Summary (Last 24 hours) at 10/10/15 1312 Last data filed at 10/10/15 1100  Gross per 24 hour  Intake          1323.33 ml  Output             2550 ml  Net         -1226.67 ml   Filed Weights   10/07/15 2319  Weight: 74.6 kg (164 lb 7.4 oz)    Examination:  General exam: Appears calm and comfortable  Respiratory system: Clear to auscultation. Respiratory effort normal. Cardiovascular system: S1 & S2 heard, RRR. No JVD, murmurs, rubs, gallops or clicks. No pedal edema. Gastrointestinal system: Abdomen is nondistended, soft and nontender. No organomegaly or masses felt. Normal bowel sounds heard. Central nervous system: Alert and oriented. No focal neurological deficits.  Data Reviewed: I have personally reviewed following labs and imaging studies  CBC:  Recent Labs Lab 10/07/15 1840 10/08/15 0348 10/08/15 0826 10/09/15 0838  WBC 5.7 4.9  --  4.2  HGB 10.9* 10.6*  --  10.7*  HCT 32.1* 31.6* 30.5* 31.4*  MCV 88.2 88.8  --  87.0  PLT 239 213  --  212   Basic Metabolic Panel:  Recent Labs Lab 10/07/15 1840 10/08/15 0348 10/09/15 0838 10/10/15 0536  NA 133* 138 136 134*  K 3.9 3.6 4.0 4.8  CL 99* 104 104 101  CO2 24 25 25 26   GLUCOSE 91 94 130* 102*  BUN 43* 39* 31* 31*  CREATININE 2.89* 2.71* 2.52* 2.42*  CALCIUM 9.6 9.0 8.8* 8.8*   Liver Function Tests:  Recent Labs Lab 10/07/15 1840 10/08/15 0348  AST 19 17  ALT 19 19  ALKPHOS 43 40  BILITOT 0.8 0.7  PROT 6.7 5.9*  ALBUMIN 3.5 3.2*    Recent Labs Lab 10/08/15 0826  AMMONIA <9*   CBG:  Recent Labs Lab 10/07/15 1814  GLUCAP 85   Thyroid Function Tests:  Recent Labs  10/08/15 0826  TSH 1.557   Anemia Panel:  Recent Labs  10/08/15 0826  VITAMINB12 1,103*   Urine analysis:    Component Value Date/Time   COLORURINE YELLOW  10/07/2015 2015   APPEARANCEUR CLEAR 10/07/2015 2015   LABSPEC 1.017 10/07/2015 2015   PHURINE 6.0 10/07/2015 2015   GLUCOSEU NEGATIVE 10/07/2015 2015   HGBUR NEGATIVE 10/07/2015 2015   BILIRUBINUR NEGATIVE 10/07/2015 2015   KETONESUR NEGATIVE 10/07/2015 2015   PROTEINUR >300 (A) 10/07/2015 2015   NITRITE NEGATIVE 10/07/2015 2015   LEUKOCYTESUR NEGATIVE 10/07/2015 2015      Recent Results (from the past 240 hour(s))  MRSA PCR Screening     Status: None   Collection Time: 10/08/15 12:21 AM  Result Value Ref Range Status   MRSA by PCR NEGATIVE NEGATIVE Final    Comment:        The GeneXpert MRSA Assay (FDA approved for NASAL specimens only), is one component of a comprehensive MRSA colonization surveillance program. It is not intended to diagnose MRSA infection nor to guide or monitor treatment for MRSA infections.       Anti-infectives    None       Radiology Studies: No results found.      Scheduled Meds: . aspirin  300 mg Rectal Daily   Or  . aspirin  325 mg Oral Daily  . buPROPion  150 mg Oral Daily  . calcium-vitamin D  1 tablet Oral BID WC  . chlorhexidine  15 mL Mouth Rinse BID  . feeding supplement (GLUCERNA SHAKE)  237 mL Oral TID BM  . heparin  5,000 Units Subcutaneous Q8H  . hydrALAZINE  50 mg Oral TID  . ketoconazole  1 application Topical Once per day on Tue Thu  . mouth rinse  15 mL Mouth Rinse q12n4p  . multivitamin with minerals  1 tablet Oral Daily  . perphenazine  4 mg Oral BID  . polyethylene glycol  17 g Oral Daily  . triamcinolone cream  1 application Topical Once per day on Tue Sat   Continuous Infusions:     LOS: 2 days    Time spent: 25 min    Debbora Presto, MD [facility] Pager [phone]  If 7PM-7AM, please contact night-coverage [email or web address] Password [email or web address] 10/10/2015, 1:12 PM

## 2015-10-11 LAB — CBC
HCT: 29.9 % — ABNORMAL LOW (ref 36.0–46.0)
HEMOGLOBIN: 9.9 g/dL — AB (ref 12.0–15.0)
MCH: 29.7 pg (ref 26.0–34.0)
MCHC: 33.1 g/dL (ref 30.0–36.0)
MCV: 89.8 fL (ref 78.0–100.0)
Platelets: 197 10*3/uL (ref 150–400)
RBC: 3.33 MIL/uL — AB (ref 3.87–5.11)
RDW: 13.7 % (ref 11.5–15.5)
WBC: 4.4 10*3/uL (ref 4.0–10.5)

## 2015-10-11 LAB — BASIC METABOLIC PANEL
Anion gap: 8 (ref 5–15)
BUN: 36 mg/dL — AB (ref 6–20)
CHLORIDE: 102 mmol/L (ref 101–111)
CO2: 25 mmol/L (ref 22–32)
Calcium: 8.8 mg/dL — ABNORMAL LOW (ref 8.9–10.3)
Creatinine, Ser: 2.59 mg/dL — ABNORMAL HIGH (ref 0.44–1.00)
GFR calc Af Amer: 20 mL/min — ABNORMAL LOW (ref >60)
GFR calc non Af Amer: 18 mL/min — ABNORMAL LOW (ref >60)
GLUCOSE: 108 mg/dL — AB (ref 65–99)
POTASSIUM: 4.3 mmol/L (ref 3.5–5.1)
Sodium: 135 mmol/L (ref 135–145)

## 2015-10-11 MED ORDER — AMLODIPINE BESYLATE 5 MG PO TABS
5.0000 mg | ORAL_TABLET | Freq: Every day | ORAL | Status: DC
  Administered 2015-10-11 – 2015-10-12 (×2): 5 mg via ORAL
  Filled 2015-10-11 (×2): qty 1

## 2015-10-11 NOTE — Progress Notes (Addendum)
PROGRESS NOTE    Julie Salinas  WUJ:[id number] DOB: September 16, 1945 DOA: 10/07/2015 [facility]   Brief Narrative:  70 y.o. female with the known history of vascular dementia, hypertension, psoriasis, chronic kidney disease stage IV as per the records from the nursing home, was brought to the ER after patient had gradually declining mental status over the last 3 days prior to this admission. As per the ER physician and the ER nurse, patient was confused and was not answering questions, repeating the same question on arrival in the ER for at least 2-3 hours. CT head was unremarkable. ER physician discussed with on-call neurologist who advised to get MRI and if it rules in for stroke then further workup. Patient's creatinine is around 2.8 and ER physician had discussed with the nurse at the nursing home who stated last creatinine was ~ 1.8.   Assessment & Plan:  Acute encephalopathy  - multifactorial and secondary to hypertensive urgency, acute kidney injury and likely progressive and worsening vascular dementia  - more confused this AM  - continues to decline MRI  - ammonia levels, TSH, RPR, B12, EEG and folate levels- normal - will attempt to ambulate and determine if pt ready for discharge   Hypertension uncontrolled/hypertensive urgency - currently on hydralazine 50 mg TID - Norvasc added and SBO still elevated, reassess in AM  Chronic kidney disease stage IV  - as per the charts from the nursing home  - pt was started on IVF and Cr continues trending up  - would like to see more improvement in Cr prior to discharge   Vascular dementia - more confused this AM - monitor today    DVT prophylaxis:  SQ Heparin  Code Status: DNR  Family Communication: No family at bedside   Disposition Plan:  From ALF?-- PT consult- suspect will need higher level of care  Consultants:  None  Subjective: Pt more confused today but was able to recognize me.    Objective: Vitals:   10/10/15 1318 10/10/15 2102 10/11/15 0435 10/11/15 1446  BP: (!) 162/87 (!) 159/72 (!) 176/72 (!) 167/71  Pulse: 85 86 86 82  Resp: 20 20 18 18   Temp: 98.3 F (36.8 C) 98.8 F (37.1 C) 98.7 F (37.1 C) 98.7 F (37.1 C)  TempSrc: Oral Oral Oral Oral  SpO2: 97% 96% 95% 95%  Weight:      Height:        Intake/Output Summary (Last 24 hours) at 10/11/15 1459 Last data filed at 10/11/15 0807  Gross per 24 hour  Intake              390 ml  Output              200 ml  Net              190 ml   Filed Weights   10/07/15 2319  Weight: 74.6 kg (164 lb 7.4 oz)    Examination:  General exam: Appears calm and comfortable  Respiratory system: Clear to auscultation. Respiratory effort normal. Cardiovascular system: S1 & S2 heard, RRR. No JVD, murmurs, rubs, gallops or clicks. No pedal edema. Gastrointestinal system: Abdomen is nondistended, soft and nontender. No organomegaly or masses felt. Normal bowel sounds heard. Central nervous system: Alert, confused, answers some questions and follows very simple commands  Data Reviewed: I have personally reviewed following labs and imaging studies  CBC:  Recent Labs Lab 10/07/15 1840 10/08/15 0348 10/08/15 0826 10/09/15 [id number] 10/11/15 0502  WBC 5.7 4.9  --  4.2 4.4  HGB 10.9* 10.6*  --  10.7* 9.9*  HCT 32.1* 31.6* 30.5* 31.4* 29.9*  MCV 88.2 88.8  --  87.0 89.8  PLT 239 213  --  212 197   Basic Metabolic Panel:  Recent Labs Lab 10/07/15 1840 10/08/15 0348 10/09/15 0838 10/10/15 0536 10/11/15 0502  NA 133* 138 136 134* 135  K 3.9 3.6 4.0 4.8 4.3  CL 99* 104 104 101 102  CO2 24 25 25 26 25   GLUCOSE 91 94 130* 102* 108*  BUN 43* 39* 31* 31* 36*  CREATININE 2.89* 2.71* 2.52* 2.42* 2.59*  CALCIUM 9.6 9.0 8.8* 8.8* 8.8*   Liver Function Tests:  Recent Labs Lab 10/07/15 1840 10/08/15 0348  AST 19 17  ALT 19 19  ALKPHOS 43 40  BILITOT 0.8 0.7  PROT 6.7 5.9*  ALBUMIN 3.5 3.2*    Recent  Labs Lab 10/08/15 0826  AMMONIA <9*   CBG:  Recent Labs Lab 10/07/15 1814  GLUCAP 85   Thyroid Function Tests: No results for input(s): TSH, T4TOTAL, FREET4, T3FREE, THYROIDAB in the last 72 hours. Anemia Panel: No results for input(s): VITAMINB12, FOLATE, FERRITIN, TIBC, IRON, RETICCTPCT in the last 72 hours. Urine analysis:    Component Value Date/Time   COLORURINE YELLOW 10/07/2015 2015   APPEARANCEUR CLEAR 10/07/2015 2015   LABSPEC 1.017 10/07/2015 2015   PHURINE 6.0 10/07/2015 2015   GLUCOSEU NEGATIVE 10/07/2015 2015   HGBUR NEGATIVE 10/07/2015 2015   BILIRUBINUR NEGATIVE 10/07/2015 2015   KETONESUR NEGATIVE 10/07/2015 2015   PROTEINUR >300 (A) 10/07/2015 2015   NITRITE NEGATIVE 10/07/2015 2015   LEUKOCYTESUR NEGATIVE 10/07/2015 2015      Recent Results (from the past 240 hour(s))  MRSA PCR Screening     Status: None   Collection Time: 10/08/15 12:21 AM  Result Value Ref Range Status   MRSA by PCR NEGATIVE NEGATIVE Final    Comment:        The GeneXpert MRSA Assay (FDA approved for NASAL specimens only), is one component of a comprehensive MRSA colonization surveillance program. It is not intended to diagnose MRSA infection nor to guide or monitor treatment for MRSA infections.       Anti-infectives    None       Radiology Studies: No results found.      Scheduled Meds: . aspirin  300 mg Rectal Daily   Or  . aspirin  325 mg Oral Daily  . buPROPion  150 mg Oral Daily  . calcium-vitamin D  1 tablet Oral BID WC  . chlorhexidine  15 mL Mouth Rinse BID  . feeding supplement (GLUCERNA SHAKE)  237 mL Oral TID BM  . heparin  5,000 Units Subcutaneous Q8H  . hydrALAZINE  50 mg Oral TID  . ketoconazole  1 application Topical Once per day on Tue Thu  . mouth rinse  15 mL Mouth Rinse q12n4p  . multivitamin with minerals  1 tablet Oral Daily  . perphenazine  4 mg Oral BID  . polyethylene glycol  17 g Oral Daily  . triamcinolone cream  1  application Topical Once per day on Tue Sat   Continuous Infusions:     LOS: 3 days    Time spent: 25 min    Debbora Presto, MD [facility] Pager [phone]  If 7PM-7AM, please contact night-coverage [email or web address] Password [email or web address] 10/11/2015, 2:59 PM

## 2015-10-12 LAB — CBC
HCT: 32.2 % — ABNORMAL LOW (ref 36.0–46.0)
Hemoglobin: 10.9 g/dL — ABNORMAL LOW (ref 12.0–15.0)
MCH: 29.6 pg (ref 26.0–34.0)
MCHC: 33.9 g/dL (ref 30.0–36.0)
MCV: 87.5 fL (ref 78.0–100.0)
PLATELETS: 208 10*3/uL (ref 150–400)
RBC: 3.68 MIL/uL — ABNORMAL LOW (ref 3.87–5.11)
RDW: 13.6 % (ref 11.5–15.5)
WBC: 6.6 10*3/uL (ref 4.0–10.5)

## 2015-10-12 LAB — BASIC METABOLIC PANEL
ANION GAP: 9 (ref 5–15)
BUN: 37 mg/dL — ABNORMAL HIGH (ref 6–20)
CALCIUM: 9.4 mg/dL (ref 8.9–10.3)
CO2: 26 mmol/L (ref 22–32)
Chloride: 100 mmol/L — ABNORMAL LOW (ref 101–111)
Creatinine, Ser: 2.69 mg/dL — ABNORMAL HIGH (ref 0.44–1.00)
GFR, EST AFRICAN AMERICAN: 19 mL/min — AB (ref >60)
GFR, EST NON AFRICAN AMERICAN: 17 mL/min — AB (ref >60)
Glucose, Bld: 100 mg/dL — ABNORMAL HIGH (ref 65–99)
Potassium: 4.6 mmol/L (ref 3.5–5.1)
SODIUM: 135 mmol/L (ref 135–145)

## 2015-10-12 MED ORDER — AMLODIPINE BESYLATE 10 MG PO TABS
10.0000 mg | ORAL_TABLET | Freq: Every day | ORAL | Status: DC
  Administered 2015-10-13 – 2015-10-15 (×3): 10 mg via ORAL
  Filled 2015-10-12 (×3): qty 1

## 2015-10-12 NOTE — Care Management Important Message (Signed)
Important Message  Patient Details  Name: Julie Salinas MRN: [id number] Date of Birth: 02/17/1945   Medicare Important Message Given:  Yes    Haskell FlirtJamison, Brace Welte 10/12/2015, 10:10 AMImportant Message  Patient Details  Name: Julie Salinas MRN: [id number] Date of Birth: 02/17/1945   Medicare Important Message Given:  Yes    Haskell FlirtJamison, Morrell Fluke 10/12/2015, 10:09 AM

## 2015-10-12 NOTE — Progress Notes (Signed)
PROGRESS NOTE    Julie Salinas  WUJ:[id number] DOB: November 09, 1945 DOA: 10/07/2015 [facility]   Brief Narrative:  70 y.o. female with the known history of vascular dementia, hypertension, psoriasis, chronic kidney disease stage IV as per the records from the nursing home, was brought to the ER after patient had gradually declining mental status over the last 3 days prior to this admission. As per the ER physician and the ER nurse, patient was confused and was not answering questions, repeating the same question on arrival in the ER for at least 2-3 hours. CT head was unremarkable. ER physician discussed with on-call neurologist who advised to get MRI and if it rules in for stroke then further workup. Patient's creatinine is around 2.8 and ER physician had discussed with the nurse at the nursing home who stated last creatinine was ~ 1.8.   Assessment & Plan:  Acute encephalopathy - multifactorial and secondary to hypertensive urgency, acute kidney injury and likely progressive and worsening vascular dementia  - appears to be alert but at times during interview confused and not clear if that is her baseline, will discuss with family  - continues to decline MRI  - ammonia levels, TSH, RPR, B12, EEG and folate levels- normal - continue to mobilize   Hypertension uncontrolled/hypertensive urgency - currently on hydralazine 50 mg TID - Norvasc added and SB still elevated, SBP 170's this AM so increased the dose of Norvasc from 5 --> 10 mg   Chronic kidney disease stage IV  - as per the charts from the nursing home  - pt was started on IVF and Cr continues trending up  - will stop IVF and see how pt does off IVF   Vascular dementia - continue to monitor for 24 more hours   Vomiting - noted this AM - ok to use zofran as needed - if persistent vomiting, abd xray needed    DVT prophylaxis:  SQ Heparin  Code Status: DNR  Family Communication: No family at bedside    Disposition Plan:  Can be likley d/c in AM  Consultants:  None  Subjective: Pt says she threw up this AM.   Objective: Vitals:   10/11/15 2107 10/12/15 0527 10/12/15 0924 10/12/15 1436  BP: (!) 152/86 (!) 172/96 (!) 170/88 126/61  Pulse: 88 96  86  Resp: 18 20  18   Temp: 99.2 F (37.3 C) 98 F (36.7 C)  98.7 F (37.1 C)  TempSrc: Oral Oral  Oral  SpO2: 94% 100%  96%  Weight:      Height:        Intake/Output Summary (Last 24 hours) at 10/12/15 1507 Last data filed at 10/12/15 1500  Gross per 24 hour  Intake              360 ml  Output                0 ml  Net              360 ml   Filed Weights   10/07/15 2319  Weight: 74.6 kg (164 lb 7.4 oz)    Examination:  General exam: Appears calm and comfortable  Respiratory system: Clear to auscultation. Respiratory effort normal. Cardiovascular system: S1 & S2 heard, RRR. No JVD, murmurs, rubs, gallops or clicks. No pedal edema. Gastrointestinal system: Abdomen is nondistended, soft and nontender. No organomegaly or masses felt. Normal bowel sounds heard. Central nervous system: Alert, confused, answers some questions and  follows very simple commands  Data Reviewed: I have personally reviewed following labs and imaging studies  CBC:  Recent Labs Lab 10/07/15 1840 10/08/15 0348 10/08/15 0826 10/09/15 0838 10/11/15 0502 10/12/15 0511  WBC 5.7 4.9  --  4.2 4.4 6.6  HGB 10.9* 10.6*  --  10.7* 9.9* 10.9*  HCT 32.1* 31.6* 30.5* 31.4* 29.9* 32.2*  MCV 88.2 88.8  --  87.0 89.8 87.5  PLT 239 213  --  212 197 208   Basic Metabolic Panel:  Recent Labs Lab 10/08/15 0348 10/09/15 0838 10/10/15 0536 10/11/15 0502 10/12/15 0511  NA 138 136 134* 135 135  K 3.6 4.0 4.8 4.3 4.6  CL 104 104 101 102 100*  CO2 25 25 26 25 26   GLUCOSE 94 130* 102* 108* 100*  BUN 39* 31* 31* 36* 37*  CREATININE 2.71* 2.52* 2.42* 2.59* 2.69*  CALCIUM 9.0 8.8* 8.8* 8.8* 9.4   Liver Function Tests:  Recent Labs Lab 10/07/15 1840  10/08/15 0348  AST 19 17  ALT 19 19  ALKPHOS 43 40  BILITOT 0.8 0.7  PROT 6.7 5.9*  ALBUMIN 3.5 3.2*    Recent Labs Lab 10/08/15 0826  AMMONIA <9*   CBG:  Recent Labs Lab 10/07/15 1814  GLUCAP 85   Urine analysis:    Component Value Date/Time   COLORURINE YELLOW 10/07/2015 2015   APPEARANCEUR CLEAR 10/07/2015 2015   LABSPEC 1.017 10/07/2015 2015   PHURINE 6.0 10/07/2015 2015   GLUCOSEU NEGATIVE 10/07/2015 2015   HGBUR NEGATIVE 10/07/2015 2015   BILIRUBINUR NEGATIVE 10/07/2015 2015   KETONESUR NEGATIVE 10/07/2015 2015   PROTEINUR >300 (A) 10/07/2015 2015   NITRITE NEGATIVE 10/07/2015 2015   LEUKOCYTESUR NEGATIVE 10/07/2015 2015      Recent Results (from the past 240 hour(s))  MRSA PCR Screening     Status: None   Collection Time: 10/08/15 12:21 AM  Result Value Ref Range Status   MRSA by PCR NEGATIVE NEGATIVE Final    Comment:        The GeneXpert MRSA Assay (FDA approved for NASAL specimens only), is one component of a comprehensive MRSA colonization surveillance program. It is not intended to diagnose MRSA infection nor to guide or monitor treatment for MRSA infections.       Anti-infectives    None       Radiology Studies: No results found.      Scheduled Meds: . [START ON 10/13/2015] amLODipine  10 mg Oral Daily  . aspirin  300 mg Rectal Daily   Or  . aspirin  325 mg Oral Daily  . buPROPion  150 mg Oral Daily  . calcium-vitamin D  1 tablet Oral BID WC  . chlorhexidine  15 mL Mouth Rinse BID  . feeding supplement (GLUCERNA SHAKE)  237 mL Oral TID BM  . heparin  5,000 Units Subcutaneous Q8H  . hydrALAZINE  50 mg Oral TID  . ketoconazole  1 application Topical Once per day on Tue Thu  . mouth rinse  15 mL Mouth Rinse q12n4p  . multivitamin with minerals  1 tablet Oral Daily  . perphenazine  4 mg Oral BID  . polyethylene glycol  17 g Oral Daily  . triamcinolone cream  1 application Topical Once per day on Tue Sat   Continuous  Infusions:     LOS: 4 days    Time spent: 25 min    Debbora Presto, MD [facility] Pager [phone]  If 7PM-7AM, please contact night-coverage [email or web address] Password  [email or web address] 10/12/2015, 3:07 PM

## 2015-10-12 NOTE — Progress Notes (Signed)
Physical Therapy Treatment Patient Details Name: Julie LombardDiane P Salinas MRN: [id number] DOB: 11-16-1945 Today's Date: 10/12/2015    History of Present Illness Pt is a 70 y.o. female with the known history of vascular dementia, hypertension, psoriasis, chronic kidney disease stage IV and admitted for acute encephalopathy from assisted living.    PT Comments    Pt not agreeable to ambulate however eventually agreeable to at least perform OOB to recliner as pt would like to return to ALF.  Follow Up Recommendations  No PT follow up;Supervision for mobility/OOB     Equipment Recommendations  None recommended by PT    Recommendations for Other Services       Precautions / Restrictions Precautions Precautions: Fall    Mobility  Bed Mobility Overal bed mobility: Needs Assistance Bed Mobility: Supine to Sit     Supine to sit: Supervision        Transfers Overall transfer level: Needs assistance Equipment used: None Transfers: Sit to/from Stand;Stand Pivot Transfers Sit to Stand: Supervision Stand pivot transfers: Supervision       General transfer comment: supervision for safety  Ambulation/Gait Ambulation/Gait assistance:  (pt refused)               Stairs            Wheelchair Mobility    Modified Rankin (Stroke Patients Only)       Balance                                    Cognition Arousal/Alertness: Awake/alert Behavior During Therapy: Flat affect Overall Cognitive Status: History of cognitive impairments - at baseline                      Exercises      General Comments        Pertinent Vitals/Pain Pain Assessment: No/denies pain    Home Living                      Prior Function            PT Goals (current goals can now be found in the care plan section) Progress towards PT goals: Progressing toward goals    Frequency   PT Diagnosis  Min 3X/week   Speech apraxia  Creatinine  elevation  Acute encephalopathy - Plan: DG CHEST PORT 1 VIEW, DG CHEST PORT 1 VIEW  Other abnormalities of gait and mobility     PT Plan Current plan remains appropriate    Co-evaluation             End of Session   Activity Tolerance: Patient tolerated treatment well Patient left: in chair;with call bell/phone within reach;with chair alarm set     Time: 0950-1009 PT Time Calculation (min) (ACUTE ONLY): 19 min  Charges:  $Therapeutic Activity: 8-22 mins                    G Codes:      Kartel Wolbert,KATHrine E 10/12/2015, 1:25 PM Zenovia JarredKati Zeynab Klett, PT, DPT 10/12/2015 Pager: [phone]

## 2015-10-12 NOTE — Progress Notes (Signed)
[facility] to [facility] patient over the weekend if ready, facility would just need scripts for all new medications.   Please call weekend CSW (ph#: [phone]) to facilitate discharge.      Lincoln MaxinKelly Lennin Osmond, LCSW [facility] Clinical Social Worker cell #: [phone]

## 2015-10-13 ENCOUNTER — Inpatient Hospital Stay (HOSPITAL_COMMUNITY): Payer: Medicare Other

## 2015-10-13 LAB — BASIC METABOLIC PANEL
ANION GAP: 9 (ref 5–15)
BUN: 38 mg/dL — ABNORMAL HIGH (ref 6–20)
CALCIUM: 9 mg/dL (ref 8.9–10.3)
CO2: 26 mmol/L (ref 22–32)
CREATININE: 2.8 mg/dL — AB (ref 0.44–1.00)
Chloride: 98 mmol/L — ABNORMAL LOW (ref 101–111)
GFR calc non Af Amer: 16 mL/min — ABNORMAL LOW (ref >60)
GFR, EST AFRICAN AMERICAN: 18 mL/min — AB (ref >60)
Glucose, Bld: 122 mg/dL — ABNORMAL HIGH (ref 65–99)
Potassium: 4.8 mmol/L (ref 3.5–5.1)
SODIUM: 133 mmol/L — AB (ref 135–145)

## 2015-10-13 LAB — CBC
HCT: 33.4 % — ABNORMAL LOW (ref 36.0–46.0)
HEMOGLOBIN: 11 g/dL — AB (ref 12.0–15.0)
MCH: 29.7 pg (ref 26.0–34.0)
MCHC: 32.9 g/dL (ref 30.0–36.0)
MCV: 90.3 fL (ref 78.0–100.0)
Platelets: 234 10*3/uL (ref 150–400)
RBC: 3.7 MIL/uL — ABNORMAL LOW (ref 3.87–5.11)
RDW: 13.9 % (ref 11.5–15.5)
WBC: 5.3 10*3/uL (ref 4.0–10.5)

## 2015-10-13 MED ORDER — SODIUM CHLORIDE 0.9 % IV SOLN
INTRAVENOUS | Status: DC
  Administered 2015-10-13 – 2015-10-14 (×2): via INTRAVENOUS

## 2015-10-13 NOTE — Progress Notes (Signed)
PROGRESS NOTE    Julie Salinas  RUE:[id number] DOB: [birth year] DOA: 10/07/2015 [facility]   Brief Narrative:  70 y.o. female with the known history of vascular dementia, hypertension, psoriasis, chronic kidney disease stage IV as per the records from the nursing home, was brought to the ER after patient had gradually declining mental status over the last 3 days prior to this admission. As per the ER physician and the ER nurse, patient was confused and was not answering questions, repeating the same question on arrival in the ER for at least 2-3 hours. CT head was unremarkable. ER physician discussed with on-call neurologist who advised to get MRI and if it rules in for stroke then further workup. Patient's creatinine is around 2.8 and ER physician had discussed with the nurse at the nursing home who stated last creatinine was ~ 1.8.   Assessment & Plan:  Acute encephalopathy - multifactorial and secondary to hypertensive urgency, acute kidney injury and likely progressive and worsening vascular dementia  - appears to be alert but at times during interview confused and not clear if that is her baseline, will discuss with family  - continues to decline MRI  - ammonia levels, TSH, RPR, B12, EEG and folate levels- normal - continue to mobilize   Hypertension uncontrolled/hypertensive urgency - currently on hydralazine 50 mg TID - Norvasc added and SB now better   Acute on Chronic kidney disease stage IV  - as per the charts from the nursing home  - pt with poor oral intake and I suspect this is why Cr trending up  - will place back on IVF and request renal US   Vascular dementia - continue to monitor for 24 more hours   Vomiting - none this AM but nausea still present  - ok to use zofran as needed   DVT prophylaxis:  SQ Heparin  Code Status: DNR  Family Communication: No family at bedside   Disposition Plan:  To be determined   Consultants:   None  Subjective: Pt says she threw up this AM.   Objective: Vitals:   10/12/15 0924 10/12/15 1436 10/12/15 2325 10/13/15 0655  BP: (!) 170/88 126/61 (!) 152/60 (!) 147/64  Pulse:  86  83  Resp:  18 16 16   Temp:  98.7 F (37.1 C) 98.6 F (37 C) 97.5 F (36.4 C)  TempSrc:  Oral Oral Oral  SpO2:  96% 96% 95%  Weight:      Height:        Intake/Output Summary (Last 24 hours) at 10/13/15 1253 Last data filed at 10/13/15 1035  Gross per 24 hour  Intake              720 ml  Output                0 ml  Net              720 ml   Filed Weights   10/07/15 2319  Weight: 74.6 kg (164 lb 7.4 oz)    Examination:  General exam: Appears calm and comfortable  Respiratory system: Clear to auscultation. Respiratory effort normal. Cardiovascular system: S1 & S2 heard, RRR. No JVD, murmurs, rubs, gallops or clicks. No pedal edema. Gastrointestinal system: Abdomen is nondistended, soft and nontender. No organomegaly or masses felt. Normal bowel sounds heard. Central nervous system: Alert, confused, answers some questions and follows very simple commands  Data Reviewed: I have personally reviewed following labs and imaging  studies  CBC:  Recent Labs Lab 10/08/15 0348 10/08/15 0826 10/09/15 0838 10/11/15 0502 10/12/15 0511 10/13/15 0859  WBC 4.9  --  4.2 4.4 6.6 5.3  HGB 10.6*  --  10.7* 9.9* 10.9* 11.0*  HCT 31.6* 30.5* 31.4* 29.9* 32.2* 33.4*  MCV 88.8  --  87.0 89.8 87.5 90.3  PLT 213  --  212 197 208 234   Basic Metabolic Panel:  Recent Labs Lab 10/09/15 0838 10/10/15 0536 10/11/15 0502 10/12/15 0511 10/13/15 0859  NA 136 134* 135 135 133*  K 4.0 4.8 4.3 4.6 4.8  CL 104 101 102 100* 98*  CO2 25 26 25 26 26   GLUCOSE 130* 102* 108* 100* 122*  BUN 31* 31* 36* 37* 38*  CREATININE 2.52* 2.42* 2.59* 2.69* 2.80*  CALCIUM 8.8* 8.8* 8.8* 9.4 9.0   Liver Function Tests:  Recent Labs Lab 10/07/15 1840 10/08/15 0348  AST 19 17  ALT 19 19  ALKPHOS 43 40  BILITOT  0.8 0.7  PROT 6.7 5.9*  ALBUMIN 3.5 3.2*    Recent Labs Lab 10/08/15 0826  AMMONIA <9*   CBG:  Recent Labs Lab 10/07/15 1814  GLUCAP 85   Urine analysis:    Component Value Date/Time   COLORURINE YELLOW 10/07/2015 2015   APPEARANCEUR CLEAR 10/07/2015 2015   LABSPEC 1.017 10/07/2015 2015   PHURINE 6.0 10/07/2015 2015   GLUCOSEU NEGATIVE 10/07/2015 2015   HGBUR NEGATIVE 10/07/2015 2015   BILIRUBINUR NEGATIVE 10/07/2015 2015   KETONESUR NEGATIVE 10/07/2015 2015   PROTEINUR >300 (A) 10/07/2015 2015   NITRITE NEGATIVE 10/07/2015 2015   LEUKOCYTESUR NEGATIVE 10/07/2015 2015    Recent Results (from the past 240 hour(s))  MRSA PCR Screening     Status: None   Collection Time: 10/08/15 12:21 AM  Result Value Ref Range Status   MRSA by PCR NEGATIVE NEGATIVE Final    Comment:        The GeneXpert MRSA Assay (FDA approved for NASAL specimens only), is one component of a comprehensive MRSA colonization surveillance program. It is not intended to diagnose MRSA infection nor to guide or monitor treatment for MRSA infections.     Radiology Studies: No results found.  Scheduled Meds: . amLODipine  10 mg Oral Daily  . aspirin  300 mg Rectal Daily   Or  . aspirin  325 mg Oral Daily  . buPROPion  150 mg Oral Daily  . calcium-vitamin D  1 tablet Oral BID WC  . chlorhexidine  15 mL Mouth Rinse BID  . feeding supplement (GLUCERNA SHAKE)  237 mL Oral TID BM  . heparin  5,000 Units Subcutaneous Q8H  . hydrALAZINE  50 mg Oral TID  . ketoconazole  1 application Topical Once per day on Tue Thu  . mouth rinse  15 mL Mouth Rinse q12n4p  . multivitamin with minerals  1 tablet Oral Daily  . perphenazine  4 mg Oral BID  . polyethylene glycol  17 g Oral Daily  . triamcinolone cream  1 application Topical Once per day on Tue Sat   Continuous Infusions: . sodium chloride       LOS: 5 days    Time spent: 25 min    Debbora Presto, MD [facility] Pager  [phone]  If 7PM-7AM, please contact night-coverage [email or web address] Password [facility] 10/13/2015, 12:53 PM

## 2015-10-14 LAB — CBC
HEMATOCRIT: 34.8 % — AB (ref 36.0–46.0)
HEMOGLOBIN: 11.5 g/dL — AB (ref 12.0–15.0)
MCH: 30 pg (ref 26.0–34.0)
MCHC: 33 g/dL (ref 30.0–36.0)
MCV: 90.9 fL (ref 78.0–100.0)
Platelets: 208 10*3/uL (ref 150–400)
RBC: 3.83 MIL/uL — ABNORMAL LOW (ref 3.87–5.11)
RDW: 13.9 % (ref 11.5–15.5)
WBC: 5.1 10*3/uL (ref 4.0–10.5)

## 2015-10-14 LAB — BASIC METABOLIC PANEL
Anion gap: 8 (ref 5–15)
BUN: 37 mg/dL — AB (ref 6–20)
CHLORIDE: 100 mmol/L — AB (ref 101–111)
CO2: 27 mmol/L (ref 22–32)
CREATININE: 2.84 mg/dL — AB (ref 0.44–1.00)
Calcium: 9.1 mg/dL (ref 8.9–10.3)
GFR calc Af Amer: 18 mL/min — ABNORMAL LOW (ref >60)
GFR calc non Af Amer: 16 mL/min — ABNORMAL LOW (ref >60)
Glucose, Bld: 104 mg/dL — ABNORMAL HIGH (ref 65–99)
Potassium: 4.3 mmol/L (ref 3.5–5.1)
SODIUM: 135 mmol/L (ref 135–145)

## 2015-10-14 NOTE — Progress Notes (Signed)
PROGRESS NOTE    Julie Salinas  ZOX:[id number] DOB: [birth year] DOA: 10/07/2015 [facility]: [facility]   Brief Narrative:  70 y.o. female with the known history of vascular dementia, hypertension, psoriasis, chronic kidney disease stage IV as per the records from the nursing home, was brought to the ER after patient had gradually declining mental status over the last 3 days prior to this admission. As per the ER physician and the ER nurse, patient was confused and was not answering questions, repeating the same question on arrival in the ER for at least 2-3 hours. CT head was unremarkable. ER physician discussed with on-call neurologist who advised to get MRI and if it rules in for stroke then further workup. Patient's creatinine is around 2.8 and ER physician had discussed with the nurse at the nursing home who stated last creatinine was ~ 1.8.   Assessment & Plan:  Acute encephalopathy - multifactorial and secondary to hypertensive urgency, acute kidney injury and likely progressive and worsening vascular dementia  - appears to be alert but at times during interview still somewhat confused  - continues to decline MRI but at this point it is not really necessary as pt's neurological exam is otherwise non focal  - ammonia levels, TSH, RPR, B12, EEG and folate levels- normal - continue to mobilize   Hypertension uncontrolled/hypertensive urgency - currently on hydralazine 50 mg TID - Norvasc added and SB now better, BP this AM 114/54  Acute on Chronic kidney disease stage IV  - as per the charts from the nursing home  - pt with poor oral intake and I suspect this is why Cr trending up  - unable to get IVF started, will discuss with RN, repeat BMP in AM  Vascular dementia - continue to monitor for 24 more hours   Vomiting - none this AM but nausea still present  - ok to use zofran as needed  Hyponatremia - resolved    DVT prophylaxis:  SQ Heparin  Code  Status: DNR  Family Communication: No family at bedside   Disposition Plan:  To be determined   Consultants:  None  Subjective: Pt says she is still not eating as much.   Objective: Vitals:   10/13/15 0655 10/13/15 1452 10/13/15 2056 10/14/15 0453  BP: (!) 147/64 135/63 131/60 (!) 114/54  Pulse: 83 81  84  Resp: 16 16 16 20   Temp: 97.5 F (36.4 C) 97.8 F (36.6 C) 98.8 F (37.1 C) 97.5 F (36.4 C)  TempSrc: Oral Oral Oral Oral  SpO2: 95% 97% 97% 94%  Weight:      Height:        Intake/Output Summary (Last 24 hours) at 10/14/15 1102 Last data filed at 10/14/15 1052  Gross per 24 hour  Intake           833.75 ml  Output              602 ml  Net           231.75 ml   Filed Weights   10/07/15 2319  Weight: 74.6 kg (164 lb 7.4 oz)    Examination:  General exam: Appears calm and comfortable  Respiratory system: Clear to auscultation. Respiratory effort normal. Cardiovascular system: S1 & S2 heard, RRR. No JVD, murmurs, rubs, gallops or clicks. No pedal edema. Gastrointestinal system: Abdomen is nondistended, soft and nontender. No organomegaly or masses felt. Normal bowel sounds heard. Central nervous system: Alert, confused, answers some questions and follows  very simple commands  Data Reviewed: I have personally reviewed following labs and imaging studies  CBC:  Recent Labs Lab 10/09/15 0838 10/11/15 0502 10/12/15 0511 10/13/15 0859 10/14/15 0458  WBC 4.2 4.4 6.6 5.3 5.1  HGB 10.7* 9.9* 10.9* 11.0* 11.5*  HCT 31.4* 29.9* 32.2* 33.4* 34.8*  MCV 87.0 89.8 87.5 90.3 90.9  PLT 212 197 208 234 208   Basic Metabolic Panel:  Recent Labs Lab 10/10/15 0536 10/11/15 0502 10/12/15 0511 10/13/15 0859 10/14/15 0458  NA 134* 135 135 133* 135  K 4.8 4.3 4.6 4.8 4.3  CL 101 102 100* 98* 100*  CO2 26 25 26 26 27   GLUCOSE 102* 108* 100* 122* 104*  BUN 31* 36* 37* 38* 37*  CREATININE 2.42* 2.59* 2.69* 2.80* 2.84*  CALCIUM 8.8* 8.8* 9.4 9.0 9.1   Liver  Function Tests:  Recent Labs Lab 10/07/15 1840 10/08/15 0348  AST 19 17  ALT 19 19  ALKPHOS 43 40  BILITOT 0.8 0.7  PROT 6.7 5.9*  ALBUMIN 3.5 3.2*    Recent Labs Lab 10/08/15 0826  AMMONIA <9*   CBG:  Recent Labs Lab 10/07/15 1814  GLUCAP 85   Urine analysis:    Component Value Date/Time   COLORURINE YELLOW 10/07/2015 2015   APPEARANCEUR CLEAR 10/07/2015 2015   LABSPEC 1.017 10/07/2015 2015   PHURINE 6.0 10/07/2015 2015   GLUCOSEU NEGATIVE 10/07/2015 2015   HGBUR NEGATIVE 10/07/2015 2015   BILIRUBINUR NEGATIVE 10/07/2015 2015   KETONESUR NEGATIVE 10/07/2015 2015   PROTEINUR >300 (A) 10/07/2015 2015   NITRITE NEGATIVE 10/07/2015 2015   LEUKOCYTESUR NEGATIVE 10/07/2015 2015    Recent Results (from the past 240 hour(s))  MRSA PCR Screening     Status: None   Collection Time: 10/08/15 12:21 AM  Result Value Ref Range Status   MRSA by PCR NEGATIVE NEGATIVE Final    Radiology Studies: [place] Renal Result Date: 10/13/2015 CLINICAL DATA: No hydronephrosis. Diffusely increased renal cortical echogenicity as can be seen with chronic medical renal disease.   Scheduled Meds: . amLODipine  10 mg Oral Daily  . aspirin  300 mg Rectal Daily   Or  . aspirin  325 mg Oral Daily  . buPROPion  150 mg Oral Daily  . calcium-vitamin D  1 tablet Oral BID WC  . chlorhexidine  15 mL Mouth Rinse BID  . feeding supplement (GLUCERNA SHAKE)  237 mL Oral TID BM  . heparin  5,000 Units Subcutaneous Q8H  . hydrALAZINE  50 mg Oral TID  . ketoconazole  1 application Topical Once per day on Tue Thu  . mouth rinse  15 mL Mouth Rinse q12n4p  . multivitamin with minerals  1 tablet Oral Daily  . perphenazine  4 mg Oral BID  . polyethylene glycol  17 g Oral Daily  . triamcinolone cream  1 application Topical Once per day on Tue Sat   Continuous Infusions: . sodium chloride 75 mL/hr at 10/14/15 0601     LOS: 6 days   Time spent: 25 min  Debbora PrestoMAGICK-Besse Miron, MD [facility] Pager [phone]  If 7PM-7AM, please contact night-coverage [email or web address] Password [email or web address] 10/14/2015, 11:02 AM

## 2015-10-14 NOTE — Progress Notes (Signed)
PT Cancellation Note  Patient Details Name: Julie Salinas MRN: [id number] DOB: 1945-07-21   Cancelled Treatment:    Reason Eval/Treat Not Completed: Patient declined, (stated "I am too tired. [facility]   Rada HayHill, Uzair Godley Elizabeth 10/14/2015, 4:22 PM

## 2015-10-15 LAB — BASIC METABOLIC PANEL
Anion gap: 8 (ref 5–15)
BUN: 34 mg/dL — ABNORMAL HIGH (ref 6–20)
CALCIUM: 8.7 mg/dL — AB (ref 8.9–10.3)
CO2: 24 mmol/L (ref 22–32)
CREATININE: 2.6 mg/dL — AB (ref 0.44–1.00)
Chloride: 102 mmol/L (ref 101–111)
GFR calc non Af Amer: 17 mL/min — ABNORMAL LOW (ref >60)
GFR, EST AFRICAN AMERICAN: 20 mL/min — AB (ref >60)
Glucose, Bld: 91 mg/dL (ref 65–99)
Potassium: 4.5 mmol/L (ref 3.5–5.1)
SODIUM: 134 mmol/L — AB (ref 135–145)

## 2015-10-15 LAB — CBC
HEMATOCRIT: 29.5 % — AB (ref 36.0–46.0)
Hemoglobin: 9.9 g/dL — ABNORMAL LOW (ref 12.0–15.0)
MCH: 30.5 pg (ref 26.0–34.0)
MCHC: 33.6 g/dL (ref 30.0–36.0)
MCV: 90.8 fL (ref 78.0–100.0)
Platelets: 212 10*3/uL (ref 150–400)
RBC: 3.25 MIL/uL — ABNORMAL LOW (ref 3.87–5.11)
RDW: 13.9 % (ref 11.5–15.5)
WBC: 4.7 10*3/uL (ref 4.0–10.5)

## 2015-10-15 MED ORDER — HYDRALAZINE HCL 50 MG PO TABS: 50.0000 mg | ORAL_TABLET | Freq: Three times a day (TID) | ORAL | 0 refills | Status: AC

## 2015-10-15 MED ORDER — AMLODIPINE BESYLATE 10 MG PO TABS: 10.0000 mg | ORAL_TABLET | Freq: Every day | ORAL | 0 refills | Status: AC

## 2015-10-15 MED ORDER — ASPIRIN 81 MG PO TABS: 81.0000 mg | ORAL_TABLET | Freq: Every day | ORAL | 0 refills | Status: AC

## 2015-10-15 MED ORDER — ONDANSETRON HCL 4 MG/2ML IJ SOLN
4.0000 mg | Freq: Four times a day (QID) | INTRAMUSCULAR | Status: DC | PRN
  Administered 2015-10-15: 4 mg via INTRAVENOUS
  Filled 2015-10-15 (×2): qty 2

## 2015-10-15 NOTE — NC FL2 (Signed)
[place] MEDICAID FL2 LEVEL OF CARE SCREENING TOOL     IDENTIFICATION  Patient Name: RANIA PROTHERO Birthdate: 15-Jan-1946 Sex: female Admission Date (Current Location): 10/07/2015  [place] and [place] Number:  [facility]nes Bast [id number] P Facility and Address:  [facility],  [address] [place] [address]      Provider Number: [id number]  Attending Physician Name and Address:  Dorothea Ogle, MD  Relative Name and Phone Number:       Current Level of Care: Hospital Recommended Level of Care: Memory Care Prior Approval Number:    Date Approved/Denied:   PASRR Number: [id number]  Discharge Plan: Memory Care   Current Diagnoses: Patient Active Problem List   Diagnosis Date Noted  . 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 Acute encephalopathy 10/07/2015  . Hypertension, uncontrolled 10/07/2015  . CKD (chronic kidney disease) stage 4, GFR 15-29 ml/min (HCC) 10/07/2015  . Dementia 10/07/2015  . Encephalopathy acute 10/07/2015    Orientation RESPIRATION BLADDER Height & Weight     Self, Time, Place  Normal Continent Weight: 164 lb 7.4 oz (74.6 kg) Height:  5\' 2"  (157.5 cm)  BEHAVIORAL SYMPTOMS/MOOD NEUROLOGICAL BOWEL NUTRITION STATUS      Continent No Added Table Salt  AMBULATORY STATUS COMMUNICATION OF NEEDS Skin   Supervision Verbally Normal                       Personal Care Assistance Level of Assistance  Bathing, Dressing Bathing Assistance: Limited assistance   Dressing Assistance: Limited assistance     Functional Limitations Info             SPECIAL CARE FACTORS FREQUENCY                       Contractures      Additional Factors Info  Code Status, Allergies Code Status Info: DNR Allergies Info: NKDA           Discharge Medications: TAKE these medications   acetaminophen 500 MG tablet Commonly known as:  TYLENOL Take 500 mg by mouth every 4 (four) hours as needed for mild pain, moderate pain, fever or headache.  alum & mag  hydroxide-simeth 200-200-20 MG/5ML suspension Commonly known as:  MAALOX/MYLANTA Take 30 mLs by mouth every 6 (six) hours as needed for indigestion or heartburn.  amLODipine 10 MG tablet Commonly known as:  NORVASC Take 1 tablet (10 mg total) by mouth daily. Start taking on:  10/16/2015  aspirin 81 MG tablet Take 1 tablet (81 mg total) by mouth daily. Start taking on:  10/16/2015  buPROPion 150 MG 24 hr tablet Commonly known as:  WELLBUTRIN XL Take 150 mg by mouth daily.  calcium-vitamin D 500-200 MG-UNIT tablet Commonly known as:  OSCAL WITH D Take 1 tablet by mouth 2 (two) times daily with a meal.  guaifenesin 100 MG/5ML syrup Commonly known as:  ROBITUSSIN Take 200 mg by mouth every 6 (six) hours as needed for cough.  hydrALAZINE 50 MG tablet Commonly known as:  APRESOLINE Take 1 tablet (50 mg total) by mouth 3 (three) times daily. What changed:  medication strength  how much to take  ketoconazole 2 % shampoo Commonly known as:  NIZORAL Apply 1 application topically 2 (two) times a week. Pt applies to scalp on Tuesday and Thursday.  loperamide 2 MG capsule Commonly known as:  IMODIUM Take 2 mg by mouth as needed for diarrhea or loose stools.  magnesium hydroxide 400 MG/5ML suspension Commonly known  as:  MILK OF MAGNESIA Take 30 mLs by mouth at bedtime as needed for mild constipation.  multivitamin with minerals Tabs tablet Take 1 tablet by mouth daily.  neomycin-bacitracin-polymyxin ointment Commonly known as:  NEOSPORIN Apply 1 application topically as needed for wound care. apply to eye  perphenazine 4 MG tablet Commonly known as:  TRILAFON Take 4 mg by mouth 2 (two) times daily.  polyethylene glycol packet Commonly known as:  MIRALAX / GLYCOLAX Take 17 g by mouth daily.  triamcinolone cream 0.1 % Commonly known as:  KENALOG Apply 1 application topically 2 (two) times a week. Pt applies every Tuesday and Saturday.     Relevant Imaging Results:  Relevant Lab  Results:   Additional Information SSN: [id number]  Arlyss RepressHarrison, Jeanna Giuffre F, LCSW

## 2015-10-15 NOTE — Progress Notes (Signed)
[facility]. CSW confirmed with Crystal at ALF that they are able to take patient back. Patient & sister, [name] aware. CSW spoke with [name] about patients concerns [facility], sister states that she goes through this every year due to her bipolar disorder, she gets depressed and should go back to normal around November and she'll be fine with the facility then. Discharge packet given to RN, Konrad FelixKatelyn. Patient's sister, [name] to transport back to ALF around 6:00pm.     Lincoln MaxinKelly Melton Walls, LCSW [facility] Clinical Social Worker cell #: [phone]

## 2015-10-15 NOTE — Progress Notes (Signed)
[facility]. All questions answered. Facility made aware of new diet restrictions ordered. Patient dressed and sitting up in chair, awaiting sister to arrive to provide transportation.

## 2015-10-15 NOTE — Discharge Instructions (Signed)
Acute Kidney Injury °Acute kidney injury is any condition in which there is sudden (acute) damage to the kidneys. Acute kidney injury was previously known as acute kidney failure or acute renal failure. The kidneys are two organs that lie on either side of the spine between the middle of the back and the front of the abdomen. The kidneys: °· Remove wastes and extra water from the blood.   °· Produce important hormones. These help keep bones strong, regulate blood pressure, and help create red blood cells.   °· Balance the fluids and chemicals in the blood and tissues. °A small amount of kidney damage may not cause problems, but a large amount of damage may make it difficult or impossible for the kidneys to work the way they should. Acute kidney injury may develop into long-lasting (chronic) kidney disease. It may also develop into a life-threatening disease called end-stage kidney disease. Acute kidney injury can get worse very quickly, so it should be treated right away. Early treatment may prevent other kidney diseases from developing. °CAUSES  °· A problem with blood flow to the kidneys. This may be caused by:   °¨ Blood loss.   °¨ Heart disease.   °¨ Severe burns.   °¨ Liver disease. °· Direct damage to the kidneys. This may be caused by: °¨ Some medicines.   °¨ A kidney infection.   °¨ Poisoning or consuming toxic substances.   °¨ A surgical wound.   °¨ A blow to the kidney area.   °· A problem with urine flow. This may be caused by:   °¨ Cancer.   °¨ Kidney stones.   °¨ An enlarged prostate. °SIGNS AND SYMPTOMS  °· Swelling (edema) of the legs, ankles, or feet.   °· Tiredness (lethargy).   °· Nausea or vomiting.   °· Confusion.   °· Problems with urination, such as:   °¨ Painful or burning feeling during urination.   °¨ Decreased urine production.   °¨ Frequent accidents in children who are potty trained.   °¨ Bloody urine.   °· Muscle twitches and cramps.   °· Shortness of breath.   °· Seizures.   °· Chest  pain or pressure. °Sometimes, no symptoms are present.  °DIAGNOSIS °Acute kidney injury may be detected and diagnosed by tests, including blood, urine, imaging, or kidney biopsy tests.  °TREATMENT °Treatment of acute kidney injury varies depending on the cause and severity of the kidney damage. In mild cases, no treatment may be needed. The kidneys may heal on their own. If acute kidney injury is more severe, your health care provider will treat the cause of the kidney damage, help the kidneys heal, and prevent complications from occurring. Severe cases may require a procedure to remove toxic wastes from the body (dialysis) or surgery to repair kidney damage. Surgery may involve:  °· Repair of a torn kidney.   °· Removal of an obstruction. °HOME CARE INSTRUCTIONS °· Follow your prescribed diet. °· Take medicines only as directed by your health care provider.  °· Do not take any new medicines (prescription, over-the-counter, or nutritional supplements) unless approved by your health care provider. Many medicines can worsen your kidney damage or may need to have the dose adjusted.   °· Keep all follow-up visits as directed by your health care provider. This is important. °· Observe your condition to make sure you are healing as expected. °SEEK IMMEDIATE MEDICAL CARE IF: °· You are feeling ill or have severe pain in the back or side.   °· Your symptoms return or you have new symptoms. °· You have any symptoms of end-stage kidney disease. These include:   °¨ Persistent itchiness.   °¨   Loss of appetite.   °¨ Headaches.   °¨ Abnormally dark or light skin. °¨ Numbness in the hands or feet.   °¨ Easy bruising.   °¨ Frequent hiccups.   °¨ Menstruation stops.   °· You have a fever. °· You have increased urine production. °· You have pain or bleeding when urinating. °MAKE SURE YOU:  °· Understand these instructions. °· Will watch your condition. °· Will get help right away if you are not doing well or get worse. °  °This  information is not intended to replace advice given to you by your health care provider. Make sure you discuss any questions you have with your health care provider. °  °Document Released: 07/29/2010 Document Revised: 02/03/2014 Document Reviewed: 09/12/2011 °Elsevier Interactive Patient Education ©2016 [employer] ° °

## 2015-10-15 NOTE — Discharge Summary (Signed)
Physician Discharge Summary  Andreina Outten Rendall ZOX:[id number] DOB: 09/06/1945 DOA: 10/07/2015  PCP: [facility]  Admit date: 10/07/2015 Discharge date: 10/15/2015  Recommendations for Outpatient Follow-up:  1. Pt will need to follow up with PCP in 1-2 weeks post discharge 2. Please obtain BMP to evaluate electrolytes and kidney function 3. Please also check CBC to evaluate Hg and Hct levels  Discharge Diagnoses:  Principal Problem:   Acute encephalopathy Active Problems:   Apraxia   Encephalopathy acute  Discharge Condition: Stable  Diet recommendation: Heart healthy diet discussed in details   Brief Narrative:  70 y.o.femalewith the known history of vascular dementia, hypertension, psoriasis, chronic kidney disease stage IV as per the records from the nursing home, was brought to the ER after patient had gradually declining mental status over the last 3 days prior to this admission. As per the ER physician and the ER nurse, patient was confused and was not answering questions, repeating the same question on arrival in the ER for at least 2-3 hours. CT head was unremarkable. ER physician discussed with on-call neurologist who advised to get MRI and if it rules infor stroke then further workup. Patient's creatinine is around 2.8 and ER physician had discussed with the nurse at the nursing home who stated last creatinine was ~ 1.8.   Assessment & Plan:  Acute encephalopathy - multifactorial and secondary to hypertensive urgency, acute kidney injury and likely progressive and worsening vascular dementia  - appears to be alert and at baseline  - continues to decline MRI but at this point it is not really necessary as pt's neurological exam is otherwise non focal  - ammonia levels, TSH,RPR,B12, EEG and folate levels- normal - continue to mobilize   Hypertension uncontrolled/hypertensive urgency - currently on hydralazine 50 mg TID - Norvasc added and SB now  better  Acute on Chronic kidney disease stage IV - as per the charts from the nursing home  - pt with poor oral intake and I suspect this is why Cr trending up  - has responded to IVF and Cr trending down, pt does not want to be discharged to the facility and says she does not want to eat much  - encouraged oral intake   Vascular dementia - continue to monitor for 24 more hours   Vomiting - none this AM  Hyponatremia - overall stable    DVT prophylaxis:  SQ Heparin  Code Status: DNR  Family Communication: No family at bedside   Disposition Plan:  To be determined   Consultants:  None  Procedures/Studies: Ct Head Wo Contrast  Result Date: 10/07/2015 CLINICAL DATA:  Altered mental status.  Delayed speech. EXAM: CT HEAD WITHOUT CONTRAST TECHNIQUE: Contiguous axial images were obtained from the base of the skull through the vertex without intravenous contrast. COMPARISON:  08/14/2014 FINDINGS: Brain: No evidence of acute infarction, hemorrhage, hydrocephalus, extra-axial collection or mass lesion/mass effect.There is Vascular: No hyperdense vessel or unexpected calcification. Skull: Normal. Negative for fracture or focal lesion. Sinuses/Orbits: No acute finding. Other: None. IMPRESSION: 1. No acute intracranial abnormalities.  A Electronically Signed   By: Signa Kell M.D.   On: 10/07/2015 20:30   US Renal  Result Date: 10/13/2015 CLINICAL DATA:  Patient with history of renal disease. Acute renal failure. Hypertension. EXAM: RENAL / URINARY TRACT ULTRASOUND COMPLETE COMPARISON:  None. FINDINGS: Right Kidney: Length: 9.8 cm. Diffusely increased in echogenicity. No hydronephrosis. Left Kidney: Length: 9.3 cm. Diffusely increased in echogenicity. No hydronephrosis. Multiple cysts  within the left kidney. Reference cyst within the inferior pole measures 2.2 x 1.1 x 1.6 cm. Cyst within the superior pole of the left kidney measures 2.6 x 1.6 x 1.8 cm. Bladder: Appears  normal for degree of bladder distention. IMPRESSION: No hydronephrosis. Diffusely increased renal cortical echogenicity as can be seen with chronic medical renal disease. Electronically Signed   By: Annia Beltrew  Davis M.D.   On: 10/13/2015 17:47   Dg Chest Port 1 View  Result Date: 10/08/2015 CLINICAL DATA:  70 year old female with shortness of breath. Initial encounter. EXAM: PORTABLE CHEST 1 VIEW COMPARISON:  [facility] 08/14/2014. FINDINGS: Portable AP upright view at 0754 hours. Improved lung volumes. Mediastinal contours remain normal. Visualized tracheal air column is within normal limits. Mild Pulmonary vascular congestion without overt edema. No pneumothorax, pleural effusion or confluent pulmonary opacity. No acute osseous abnormality identified. IMPRESSION: No acute cardiopulmonary abnormality; mild vascular congestion without overt edema. Electronically Signed   By: Odessa FlemingH  Hall M.D.   On: 10/08/2015 08:24   Discharge Exam: Vitals:   10/14/15 2050 10/15/15 0508  BP: (!) 125/55 (!) 132/58  Pulse: 81 77  Resp: 20 20  Temp: 98.8 F (37.1 C) 98.1 F (36.7 C)   Vitals:   10/14/15 1457 10/14/15 1953 10/14/15 2050 10/15/15 0508  BP: (!) 117/52 (!) 126/50 (!) 125/55 (!) 132/58  Pulse: 82 79 81 77  Resp: (!) 22  20 20   Temp: 98 F (36.7 C)  98.8 F (37.1 C) 98.1 F (36.7 C)  TempSrc: Oral  Oral Oral  SpO2: 98%  97% 95%  Weight:      Height:        General: Pt is alert, not in acute distress Cardiovascular: Regular rate and rhythm, S1/S2 +, no rubs, no gallops Respiratory: Clear to auscultation bilaterally, no wheezing, no crackles, no rhonchi Abdominal: Soft, non tender, non distended, bowel sounds +, no guarding   Discharge Instructions  Discharge Instructions    Diet - low sodium heart healthy    Complete by:  As directed    Increase activity slowly    Complete by:  As directed        Medication List    TAKE these medications    acetaminophen 500 MG tablet Commonly known as:  TYLENOL Take 500 mg by mouth every 4 (four) hours as needed for mild pain, moderate pain, fever or headache.   alum & mag hydroxide-simeth 200-200-20 MG/5ML suspension Commonly known as:  MAALOX/MYLANTA Take 30 mLs by mouth every 6 (six) hours as needed for indigestion or heartburn.   amLODipine 10 MG tablet Commonly known as:  NORVASC Take 1 tablet (10 mg total) by mouth daily. Start taking on:  10/16/2015   aspirin 81 MG tablet Take 1 tablet (81 mg total) by mouth daily. Start taking on:  10/16/2015   buPROPion 150 MG 24 hr tablet Commonly known as:  WELLBUTRIN XL Take 150 mg by mouth daily.   calcium-vitamin D 500-200 MG-UNIT tablet Commonly known as:  OSCAL WITH D Take 1 tablet by mouth 2 (two) times daily with a meal.   guaifenesin 100 MG/5ML syrup Commonly known as:  ROBITUSSIN Take 200 mg by mouth every 6 (six) hours as needed for cough.   hydrALAZINE 50 MG tablet Commonly known as:  APRESOLINE Take 1 tablet (50 mg total) by mouth 3 (three) times daily. What changed:  medication strength  how much to take   ketoconazole 2 % shampoo Commonly known  as:  NIZORAL Apply 1 application topically 2 (two) times a week. Pt applies to scalp on Tuesday and Thursday.   loperamide 2 MG capsule Commonly known as:  IMODIUM Take 2 mg by mouth as needed for diarrhea or loose stools.   magnesium hydroxide 400 MG/5ML suspension Commonly known as:  MILK OF MAGNESIA Take 30 mLs by mouth at bedtime as needed for mild constipation.   multivitamin with minerals Tabs tablet Take 1 tablet by mouth daily.   neomycin-bacitracin-polymyxin ointment Commonly known as:  NEOSPORIN Apply 1 application topically as needed for wound care. apply to eye   perphenazine 4 MG tablet Commonly known as:  TRILAFON Take 4 mg by mouth 2 (two) times daily.   polyethylene glycol packet Commonly known as:  MIRALAX / GLYCOLAX Take 17 g by mouth  daily.   triamcinolone cream 0.1 % Commonly known as:  KENALOG Apply 1 application topically 2 (two) times a week. Pt applies every Tuesday and Saturday.      Follow-up Information    [facility] .   Contact information: [address] [place] [address] [phone]            The results of significant diagnostics from this hospitalization (including imaging, microbiology, ancillary and laboratory) are listed below for reference.     Microbiology: Recent Results (from the past 240 hour(s))  MRSA PCR Screening     Status: None   Collection Time: 10/08/15 12:21 AM  Result Value Ref Range Status   MRSA by PCR NEGATIVE NEGATIVE Final    Comment:        The GeneXpert MRSA Assay (FDA approved for NASAL specimens only), is one component of a comprehensive MRSA colonization surveillance program. It is not intended to diagnose MRSA infection nor to guide or monitor treatment for MRSA infections.      Labs: Basic Metabolic Panel:  Recent Labs Lab 10/11/15 0502 10/12/15 0511 10/13/15 0859 10/14/15 0458 10/15/15 0325  NA 135 135 133* 135 134*  K 4.3 4.6 4.8 4.3 4.5  CL 102 100* 98* 100* 102  CO2 25 26 26 27 24   GLUCOSE 108* 100* 122* 104* 91  BUN 36* 37* 38* 37* 34*  CREATININE 2.59* 2.69* 2.80* 2.84* 2.60*  CALCIUM 8.8* 9.4 9.0 9.1 8.7*   CBC:  Recent Labs Lab 10/11/15 0502 10/12/15 0511 10/13/15 0859 10/14/15 0458 10/15/15 0325  WBC 4.4 6.6 5.3 5.1 4.7  HGB 9.9* 10.9* 11.0* 11.5* 9.9*  HCT 29.9* 32.2* 33.4* 34.8* 29.5*  MCV 89.8 87.5 90.3 90.9 90.8  PLT 197 208 234 208 212    SIGNED: Time coordinating discharge:  30 minutes  MAGICK-Jossalyn Forgione, MD  [facility] 10/15/2015, 11:07 AM Pager [phone]  If 7PM-7AM, please contact night-coverage [email or web address] Password [email or web address]

## 2015-10-15 NOTE — Progress Notes (Signed)
Physical Therapy Treatment Patient Details Name: Julie LombardDiane P Salinas MRN: [id number] DOB: Jun 05, 1945 Today's Date: 10/15/2015    History of Present Illness Pt is a 70 y.o. female with the known history of vascular dementia, hypertension, psoriasis, chronic kidney disease stage IV and admitted for acute encephalopathy from assisted living.    PT Comments    Pt stated "Im dying" as soon as therapist entered room. When asked why she said that she stated "I don't know. I just said it." Pt agreeable to ambulation on today. She tolerated distance well. Pt did c/o fatigue and weakness during this session. Will recommend HHPT follow up if pt is agreeable.   Follow Up Recommendations  Home health PT;Supervision for mobility/OOB     Equipment Recommendations  None recommended by PT    Recommendations for Other Services       Precautions / Restrictions Precautions Precautions: Fall Restrictions Weight Bearing Restrictions: No    Mobility  Bed Mobility Overal bed mobility: Needs Assistance Bed Mobility: Supine to Sit     Supine to sit: Min assist     General bed mobility comments: Assist for trunk. Increased time. Decreased effort from pt.  Transfers Overall transfer level: Needs assistance Equipment used: 1 person hand held assist   Sit to Stand: Min assist         General transfer comment: Assist to rise.   Ambulation/Gait Ambulation/Gait assistance: Min guard Ambulation Distance (Feet): 200 Feet Assistive device: None Gait Pattern/deviations: Step-through pattern;Decreased stride length;Decreased step length - right;Decreased step length - left;Narrow base of support     General Gait Details: Decreased arm swing. No LOB. Pt reported feeling fatigued after walk.   Stairs            Wheelchair Mobility    Modified Rankin (Stroke Patients Only)       Balance                                    Cognition Arousal/Alertness:  Awake/alert Behavior During Therapy: Flat affect Overall Cognitive Status: History of cognitive impairments - at baseline                      Exercises      General Comments        Pertinent Vitals/Pain Pain Assessment: No/denies pain    Home Living                      Prior Function            PT Goals (current goals can now be found in the care plan section) Progress towards PT goals: Progressing toward goals    Frequency    Min 3X/week      PT Plan Current plan remains appropriate    Co-evaluation             End of Session Equipment Utilized During Treatment: Gait belt Activity Tolerance: Patient tolerated treatment well Patient left: in chair;with call bell/phone within reach;with chair alarm set     Time: [id number] PT Time Calculation (min) (ACUTE ONLY): 13 min  Charges:  $Gait Training: 8-22 mins                    G Codes:      Rebeca AlertJannie Ananias Kolander, MPT Pager: [phone]

## 2015-10-15 NOTE — Progress Notes (Signed)
Speech Language Pathology Treatment:    Patient Details Name: Julie Salinas MRN: [id number] DOB: 1945-06-19 Today's Date: 10/15/2015 Time: [id number] SLP Time Calculation (min) (ACUTE ONLY): 26 min  Assessment / Plan / Recommendation Clinical Impression  Pt appears somewhat resistant to SLP intervention today stating, "Why did you have to come in here?" when SLP demonstrated use of thickener.   SLP explained to pt that swallowing is being addressed and slight increased viscocity of nectar may help to accommodate oropharyngeal deficit.    Pt today denies decreased coughing with use of thickener but given her baseline dementia, uncertain to ability for her to recall.  She previously found this helpful.   Pt also states she has reflux but has no history of it listed in her chart?    She did clearly state [facility] and suspect this may contributing to her decreased attention/participation today.    Pt finally did consume 4 ounces of thickened water - swallow was presumed to be delayed but no indication of airway compromise/aspiration.  Pt declined to try to consume thin drinks nor ice stating she "will have to pee".  SLP is concerned pt may be decreasing her liquids due to mobility issues.    Recommend dys3/nectar/ice chips continue and follow up with SLP at next venue of care to assure pt on least restrictive diet.     HPI HPI: 70 yo [facility].  PMH + for Dementia, CKD IV, psoriasis.  Her CT head negative, MRI pending, CXR negative.  Pt for swallow evaluation.  She appears to have tremorous movement of her hands that she states is old.  Pt refused MRI.        SLP Plan  Continue with current plan of care     Recommendations  Diet recommendations: Dysphagia 3 (mechanical soft);Nectar-thick liquid;Other(comment) (ice chips) Liquids provided via: Cup;No straw Medication Administration: Whole meds with puree Supervision: Patient able  to self feed Compensations: Slow rate;Small sips/bites Postural Changes and/or Swallow Maneuvers: Seated upright 90 degrees;Upright 30-60 min after meal                Oral Care Recommendations: Oral care BID Follow up Recommendations: Home health SLP Plan: Continue with current plan of care       GO              Donavan Burnetamara Idaliz Tinkle, MS [facility] SLP [phone]

## 2016-01-10 ENCOUNTER — Emergency Department (HOSPITAL_COMMUNITY)
Admission: EM | Admit: 2016-01-10 | Discharge: 2016-01-28 | Disposition: E | Payer: Medicare Other | Attending: Emergency Medicine | Admitting: Emergency Medicine

## 2016-01-10 ENCOUNTER — Emergency Department (HOSPITAL_COMMUNITY): Payer: Medicare Other

## 2016-01-10 ENCOUNTER — Encounter (HOSPITAL_COMMUNITY): Payer: Self-pay | Admitting: *Deleted

## 2016-01-10 DIAGNOSIS — Z7982 Long term (current) use of aspirin: Secondary | ICD-10-CM | POA: Diagnosis not present

## 2016-01-10 DIAGNOSIS — Z79899 Other long term (current) drug therapy: Secondary | ICD-10-CM | POA: Diagnosis not present

## 2016-01-10 DIAGNOSIS — N184 Chronic kidney disease, stage 4 (severe): Secondary | ICD-10-CM | POA: Insufficient documentation

## 2016-01-10 DIAGNOSIS — I129 Hypertensive chronic kidney disease with stage 1 through stage 4 chronic kidney disease, or unspecified chronic kidney disease: Secondary | ICD-10-CM | POA: Insufficient documentation

## 2016-01-10 DIAGNOSIS — I469 Cardiac arrest, cause unspecified: Secondary | ICD-10-CM | POA: Insufficient documentation

## 2016-01-10 LAB — COMPREHENSIVE METABOLIC PANEL
ALBUMIN: 3.1 g/dL — AB (ref 3.5–5.0)
ALK PHOS: 48 U/L (ref 38–126)
ALT: 128 U/L — AB (ref 14–54)
AST: 150 U/L — AB (ref 15–41)
Anion gap: 13 (ref 5–15)
BILIRUBIN TOTAL: 0.2 mg/dL — AB (ref 0.3–1.2)
BUN: 47 mg/dL — AB (ref 6–20)
CO2: 29 mmol/L (ref 22–32)
CREATININE: 3.72 mg/dL — AB (ref 0.44–1.00)
Calcium: 8.6 mg/dL — ABNORMAL LOW (ref 8.9–10.3)
Chloride: 99 mmol/L — ABNORMAL LOW (ref 101–111)
GFR calc Af Amer: 13 mL/min — ABNORMAL LOW (ref >60)
GFR, EST NON AFRICAN AMERICAN: 11 mL/min — AB (ref >60)
GLUCOSE: 151 mg/dL — AB (ref 65–99)
Potassium: 4.6 mmol/L (ref 3.5–5.1)
Sodium: 141 mmol/L (ref 135–145)
TOTAL PROTEIN: 5.4 g/dL — AB (ref 6.5–8.1)

## 2016-01-10 LAB — CBC WITH DIFFERENTIAL/PLATELET
BASOS PCT: 0 %
Basophils Absolute: 0 10*3/uL (ref 0.0–0.1)
EOS ABS: 0 10*3/uL (ref 0.0–0.7)
Eosinophils Relative: 0 %
HEMATOCRIT: 26.7 % — AB (ref 36.0–46.0)
HEMOGLOBIN: 8.4 g/dL — AB (ref 12.0–15.0)
LYMPHS ABS: 0.8 10*3/uL (ref 0.7–4.0)
Lymphocytes Relative: 5 %
MCH: 30.5 pg (ref 26.0–34.0)
MCHC: 31.5 g/dL (ref 30.0–36.0)
MCV: 97.1 fL (ref 78.0–100.0)
MONO ABS: 0.9 10*3/uL (ref 0.1–1.0)
MONOS PCT: 6 %
NEUTROS PCT: 89 %
Neutro Abs: 12.7 10*3/uL — ABNORMAL HIGH (ref 1.7–7.7)
Platelets: 190 10*3/uL (ref 150–400)
RBC: 2.75 MIL/uL — ABNORMAL LOW (ref 3.87–5.11)
RDW: 14.9 % (ref 11.5–15.5)
WBC: 14.5 10*3/uL — ABNORMAL HIGH (ref 4.0–10.5)

## 2016-01-10 LAB — PROTIME-INR
INR: 1.06
Prothrombin Time: 13.8 seconds (ref 11.4–15.2)

## 2016-01-10 LAB — I-STAT TROPONIN, ED: Troponin i, poc: 0.09 ng/mL (ref 0.00–0.08)

## 2016-01-10 LAB — I-STAT CG4 LACTIC ACID, ED: LACTIC ACID, VENOUS: 6.14 mmol/L — AB (ref 0.5–1.9)

## 2016-01-10 MED ORDER — MIDAZOLAM HCL 2 MG/2ML IJ SOLN
INTRAMUSCULAR | Status: AC
  Filled 2016-01-10: qty 2

## 2016-01-10 MED ORDER — LORAZEPAM 2 MG/ML PO CONC: 1.0000 mg | ORAL | Status: DC | PRN

## 2016-01-10 MED ORDER — CALCIUM CHLORIDE 10 % IV SOLN
INTRAVENOUS | Status: AC | PRN
  Administered 2016-01-10: 1 g via INTRAVENOUS

## 2016-01-10 MED ORDER — FENTANYL CITRATE (PF) 100 MCG/2ML IJ SOLN: 25.0000 ug | INTRAMUSCULAR | Status: DC | PRN

## 2016-01-10 MED ORDER — SODIUM CHLORIDE 0.9 % IV BOLUS (SEPSIS)
500.0000 mL | Freq: Once | INTRAVENOUS | Status: AC
  Administered 2016-01-10: 500 mL via INTRAVENOUS

## 2016-01-10 MED ORDER — LORAZEPAM 1 MG PO TABS: 1.0000 mg | ORAL_TABLET | ORAL | Status: DC | PRN

## 2016-01-10 MED ORDER — MIDAZOLAM HCL 5 MG/5ML IJ SOLN
INTRAMUSCULAR | Status: DC | PRN
  Administered 2016-01-10: 2 mg via INTRAVENOUS

## 2016-01-10 MED ORDER — ACETAMINOPHEN 650 MG RE SUPP: 650.0000 mg | Freq: Four times a day (QID) | RECTAL | Status: DC | PRN

## 2016-01-10 MED ORDER — LORAZEPAM 2 MG/ML IJ SOLN: 1.0000 mg | INTRAMUSCULAR | Status: DC | PRN

## 2016-01-10 MED ORDER — EPINEPHRINE PF 1 MG/10ML IJ SOSY
PREFILLED_SYRINGE | INTRAMUSCULAR | Status: AC | PRN
  Administered 2016-01-10: 0.5 mg via INTRAVENOUS

## 2016-01-10 MED ORDER — SODIUM BICARBONATE 8.4 % IV SOLN
INTRAVENOUS | Status: AC | PRN
  Administered 2016-01-10: 50 meq via INTRAVENOUS

## 2016-01-10 MED ORDER — ACETAMINOPHEN 325 MG PO TABS: 650.0000 mg | ORAL_TABLET | Freq: Four times a day (QID) | ORAL | Status: DC | PRN

## 2016-01-10 MED ORDER — SODIUM CHLORIDE 0.9 % IV SOLN
INTRAVENOUS | Status: AC | PRN
  Administered 2016-01-10: 1000 mL via INTRAVENOUS

## 2016-01-10 MED ORDER — ATROPINE SULFATE 1 MG/ML IJ SOLN
INTRAMUSCULAR | Status: AC | PRN
  Administered 2016-01-10: 1 mg via INTRAVENOUS

## 2016-01-28 NOTE — ED Triage Notes (Signed)
[facility] ED [facility]. Pt arrived to ED with paced rhythm at 60, bp 68/p, epi gtt, king airway, opening eyes and looking around, pulling away from pain. Per report pt [facility] PT went to work with pt pta and found pt on floor pulseless and apneic. Fire dept started CPR. EMS's first rhythm was SB. EPI x2 given enroute and EPI gtt started by [facility]. Fent/Versed given pta.

## 2016-01-28 NOTE — Code Documentation (Signed)
[facility] Dr Rubin PayorPickering talking with CCM.

## 2016-01-28 NOTE — ED Provider Notes (Addendum)
[facility]-EMERGENCY DEPT Provider Note   CSN: [id number] Arrival date & time: Jan 26, 2016  1335     History   Chief Complaint Chief Complaint  Patient presents with  . Cardiac Arrest    HPI Julie Salinas is a 71 y.o. female.  Level V caveat due to unresponsiveness. HPI Patient brought in from EMS after being found unresponsive post CPR. Last normal was around 2 hours prior to arrival. EMS got patient back with a low blood pressure around 60 on an epi drip King airway and had some opening of her eyes. Initial AED did not advise shock for fire. Fentanyl and Versed have been given by EMS because she had been more awake. Upon arrival to the ER she was minimally responsive. Breathing slightly above bagging. Some movement of arms. Patient remained hypotensive and bradycardic. Given epi atropine and bicarbonate and calcium. This increased her rate and her blood pressure. King airway was going to be changed over to ET tube with patient's family member arrived and presented at present her DO NOT RESUSCITATE with basically comfort care only. Patient was then switched to comfort care and expired shortly after. Time of death 11. Patient's primary care doctor is [facility] but I have not been able to reach and to sign the death significant. Past Medical History:  Diagnosis Date  . Dementia   . Hypertension   . Renal disorder     Patient Active Problem List   Diagnosis Date Noted  . 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 Acute encephalopathy 10/07/2015  . Hypertension, uncontrolled 10/07/2015  . CKD (chronic kidney disease) stage 4, GFR 15-29 ml/min (HCC) 10/07/2015  . Dementia 10/07/2015  . Encephalopathy acute 10/07/2015    History reviewed. No pertinent surgical history.  OB History    No data available       Home Medications    Prior to Admission medications   Medication Sig Start Date End Date Taking? Authorizing Provider  acetaminophen (TYLENOL) 500 MG tablet Take 500 mg by mouth every 4  (four) hours as needed for mild pain, moderate pain, fever or headache.    Historical Provider, MD  alum & mag hydroxide-simeth (MAALOX/MYLANTA) 200-200-20 MG/5ML suspension Take 30 mLs by mouth every 6 (six) hours as needed for indigestion or heartburn.    Historical Provider, MD  amLODipine (NORVASC) 10 MG tablet Take 1 tablet (10 mg total) by mouth daily. 10/16/15   Dorothea Ogle, MD  aspirin 81 MG tablet Take 1 tablet (81 mg total) by mouth daily. 10/16/15   Dorothea Ogle, MD  buPROPion (WELLBUTRIN XL) 150 MG 24 hr tablet Take 150 mg by mouth daily.    Historical Provider, MD  calcium-vitamin D (OSCAL WITH D) 500-200 MG-UNIT tablet Take 1 tablet by mouth 2 (two) times daily with a meal.    Historical Provider, MD  guaifenesin (ROBITUSSIN) 100 MG/5ML syrup Take 200 mg by mouth every 6 (six) hours as needed for cough.    Historical Provider, MD  hydrALAZINE (APRESOLINE) 50 MG tablet Take 1 tablet (50 mg total) by mouth 3 (three) times daily. 10/15/15   Dorothea Ogle, MD  ketoconazole (NIZORAL) 2 % shampoo Apply 1 application topically 2 (two) times a week. Pt applies to scalp on Tuesday and Thursday.    Historical Provider, MD  loperamide (IMODIUM) 2 MG capsule Take 2 mg by mouth as needed for diarrhea or loose stools.    Historical Provider, MD  magnesium hydroxide (MILK OF MAGNESIA) 400 MG/5ML suspension Take 30  mLs by mouth at bedtime as needed for mild constipation.    Historical Provider, MD  Multiple Vitamin (MULTIVITAMIN WITH MINERALS) TABS tablet Take 1 tablet by mouth daily.    Historical Provider, MD  neomycin-bacitracin-polymyxin (NEOSPORIN) ointment Apply 1 application topically as needed for wound care. apply to eye    Historical Provider, MD  perphenazine (TRILAFON) 4 MG tablet Take 4 mg by mouth 2 (two) times daily.    Historical Provider, MD  polyethylene glycol (MIRALAX / GLYCOLAX) packet Take 17 g by mouth daily.    Historical Provider, MD  triamcinolone cream (KENALOG) 0.1 % Apply 1  application topically 2 (two) times a week. Pt applies every Tuesday and Saturday.    Historical Provider, MD    Family History Family History  Problem Relation Age of Onset  . Parkinson's disease Father     Social History Social History  Substance Use Topics  . Smoking status: Never Smoker  . Smokeless tobacco: Never Used  . Alcohol use No     Allergies   Patient has no known allergies.   Review of Systems Review of Systems  Unable to perform ROS: Mental status change     Physical Exam Updated Vital Signs BP 128/60   Pulse 104   Physical Exam  Constitutional: She appears well-developed.  Eyes:  Pupils somewhat constricted and does have corneal reflex intact initially.  Neck: Neck supple.  Cardiovascular:  Bradycardia  Pulmonary/Chest:  Equal breath sounds with Brooke DareKing airway  Abdominal: There is no tenderness.  Musculoskeletal: She exhibits edema.  Neurological:  Patient initially was unresponsive but mental status did improve slightly work she will look to voice and did squeeze hand.   Skin: Capillary refill takes more than 3 seconds.     ED Treatments / Results  Labs (all labs ordered are listed, but only abnormal results are displayed) Labs Reviewed  COMPREHENSIVE METABOLIC PANEL - Abnormal; Notable for the following:       Result Value   Chloride 99 (*)    Glucose, Bld 151 (*)    BUN 47 (*)    Creatinine, Ser 3.72 (*)    Calcium 8.6 (*)    Total Protein 5.4 (*)    Albumin 3.1 (*)    AST 150 (*)    ALT 128 (*)    Total Bilirubin 0.2 (*)    GFR calc non Af Amer 11 (*)    GFR calc Af Amer 13 (*)    All other components within normal limits  CBC WITH DIFFERENTIAL/PLATELET - Abnormal; Notable for the following:    WBC 14.5 (*)    RBC 2.75 (*)    Hemoglobin 8.4 (*)    HCT 26.7 (*)    Neutro Abs 12.7 (*)    All other components within normal limits  I-STAT CG4 LACTIC ACID, ED - Abnormal; Notable for the following:    Lactic Acid, Venous 6.14 (*)     All other components within normal limits  I-STAT TROPOININ, ED - Abnormal; Notable for the following:    Troponin i, poc 0.09 (*)    All other components within normal limits  PROTIME-INR  URINALYSIS, ROUTINE W REFLEX MICROSCOPIC  I-STAT CHEM 8, ED    EKG  EKG Interpretation  Date/Time:  Thursday January 10 2016 13:39:12 EST Ventricular Rate:  42 PR Interval:    QRS Duration: 104 QT Interval:  646 QTC Calculation: 540 R Axis:   56 Text Interpretation:  Sinus bradycardia Lateral infarct, acute (LAD)  ST elevation, consider inferior injury Prolonged QT interval Confirmed by Rubin PayorPICKERING  MD, Harrold DonathNATHAN [phone]) on December 28, 2015 2:01:54 PM       Radiology No results found.  Procedures Procedures (including critical care time)  Medications Ordered in ED Medications  midazolam (VERSED) 5 MG/5ML injection ( Intravenous Canceled Entry 08/06/15 1400)  acetaminophen (TYLENOL) tablet 650 mg (not administered)    Or  acetaminophen (TYLENOL) suppository 650 mg (not administered)  fentaNYL (SUBLIMAZE) injection 25 mcg (not administered)  LORazepam (ATIVAN) tablet 1 mg (not administered)    Or  LORazepam (ATIVAN) 2 MG/ML concentrated solution 1 mg (not administered)    Or  LORazepam (ATIVAN) injection 1 mg (not administered)  sodium chloride 0.9 % bolus 500 mL (0 mLs Intravenous Stopped 08/06/15 1359)  EPINEPHrine (ADRENALIN) 1 MG/10ML injection (0.5 mg Intravenous Given 08/06/15 1343)  atropine injection (1 mg Intravenous Given 08/06/15 1344)  calcium chloride injection (1 g Intravenous Given 08/06/15 1345)  0.9 %  sodium chloride infusion ( Intravenous Stopped 08/06/15 1400)  sodium bicarbonate injection (50 mEq Intravenous Given 08/06/15 1345)     Initial Impression / Assessment and Plan / ED Course  I have reviewed the triage vital signs and the nursing notes.  Pertinent labs & imaging results that were available during my care of the patient were reviewed by me and considered  in my medical decision making (see chart for details).  Clinical Course   Patient presented with out of hospital cardiac arrest. Vitals had returned but found patient would not want this morning to be comfort care only. Care stopped the patient expired at 1329 patient had no cardiac activity at that time and no response to pain. [facility] but not been able to get through on the physician line. . Final Clinical Impressions(s) / ED Diagnoses   Final diagnoses:  Cardiac arrest [facility](HCC)    New Prescriptions New Prescriptions   No medications on file     Benjiman CoreNathan Venus Ruhe, MD 08/06/15 1519  [facility]. States have not seen her in 2 years. Maybe she had a different doctor at the nursing home. I do not have the nursing home paperwork right now. If needed I may be able to sign the death certificate    Benjiman CoreNathan Babatunde Seago, MD 08/06/15 [phone]

## 2016-01-28 NOTE — ED Notes (Signed)
Pt monitored via comfort measure mode

## 2016-01-28 NOTE — ED Notes (Signed)
Sister remains at bedside with house chaplain

## 2016-01-28 NOTE — Code Documentation (Signed)
Sister in with valid DNR sheet. Comfort measures in place.

## 2016-01-28 NOTE — ED Notes (Signed)
King airway pulled. Pt's sister wanting to ensure pt is 'pain free'. Sister reassured by md and rn that pt is pain free

## 2016-01-28 NOTE — Progress Notes (Signed)
Responded to CPR trauma page to support patient and family.  I escorted sister to bedside and remained with her until patient passed. Facilitated information sharing between patient sister and staff.  Sister did not know funeral home plans and will call later with information. Patient sister was given patient placement card along with instructions how to proceed. Provided emotional, spiritual and grief support and guidance.   24-Jul-2015 1523  Clinical Encounter Type  Visited With Patient;Family;Patient and family together;Health care provider  Visit Type Initial;Spiritual support;Death;ED  Referral From Nurse  Spiritual Encounters  Spiritual Needs Prayer;Emotional;Grief support  Stress Factors  Family Stress Factors Exhausted;Loss  Venida JarvisWatlington, Cortnee Steinmiller, Chaplain,pager [phone]

## 2016-01-28 DEATH — deceased

## 2018-05-12 IMAGING — US US RENAL
1 series · 14 of 25 positions shown · non-contrast
Comparison: None.

CLINICAL DATA: Patient with history of renal disease. Acute renal
failure. Hypertension.

EXAM:
RENAL / URINARY TRACT ULTRASOUND COMPLETE

[Series 1: us renal · 0.24mm/px · 14 of 106 slices shown]
[im 1/106]
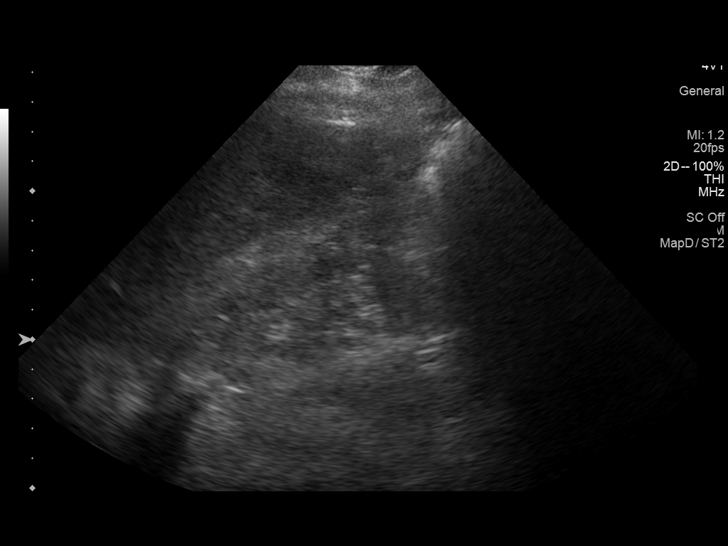
[im 9/106]
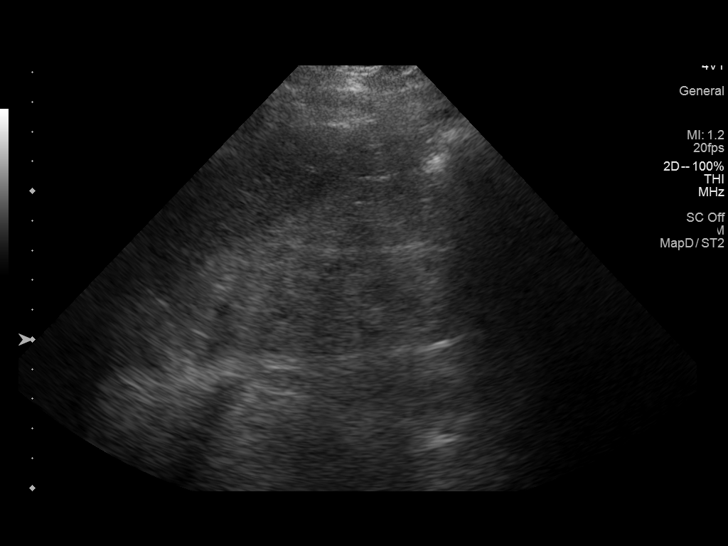
[im 18/106]
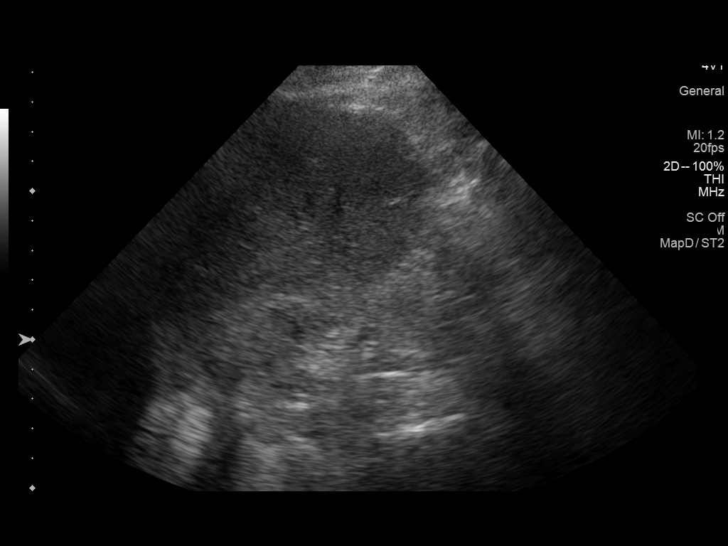
[im 27/106]
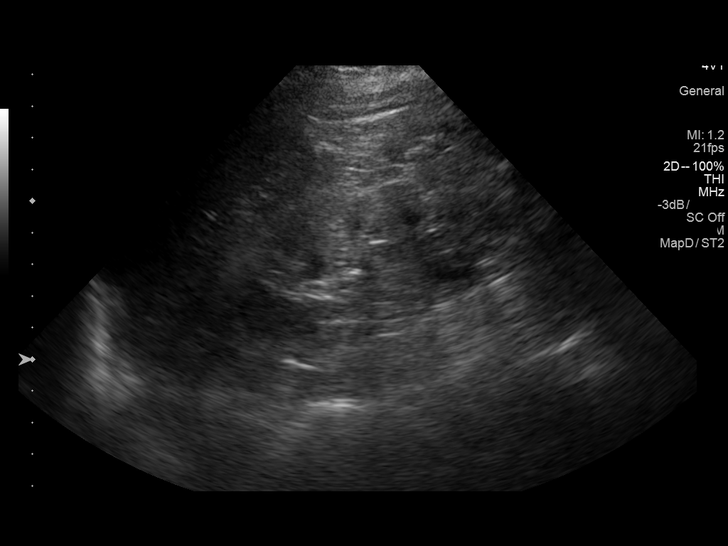
[im 36/106]
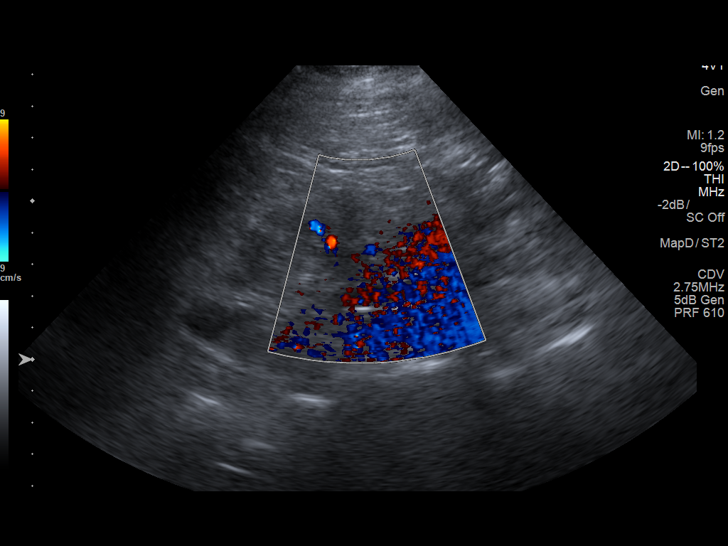
[im 40/106]
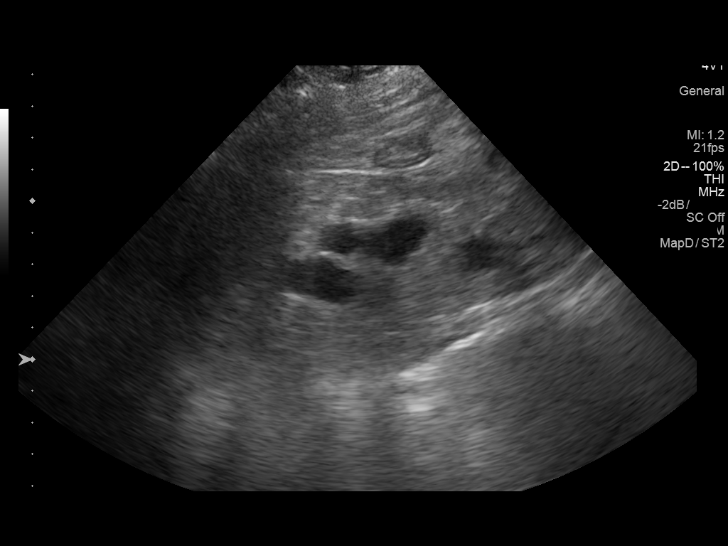
[im 49/106]
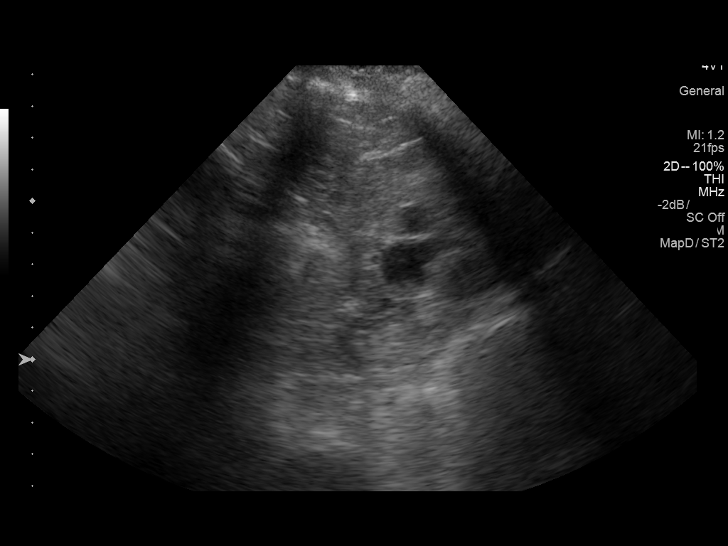
[im 57/106]
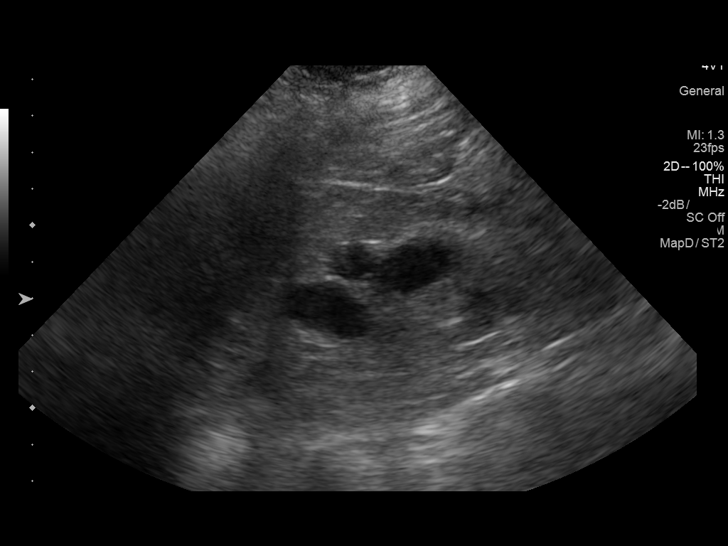
[im 66/106]
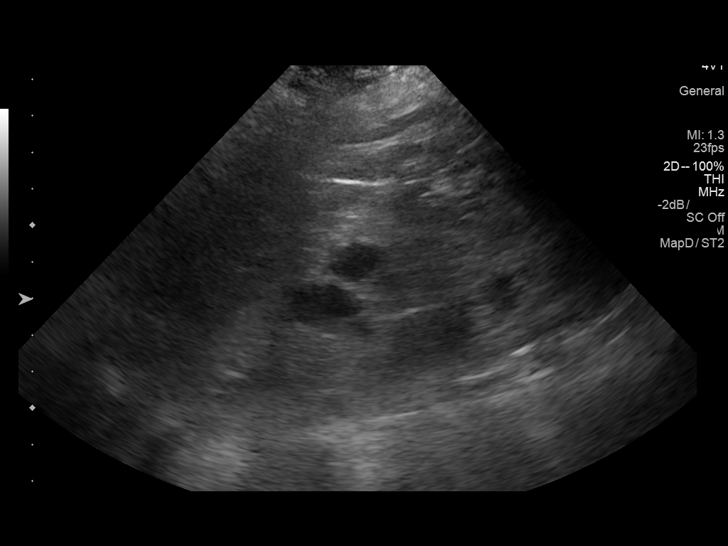
[im 71/106]
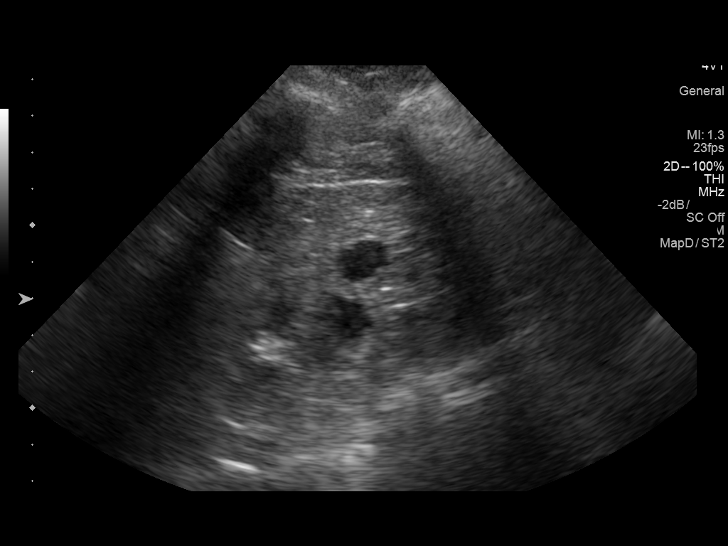
[im 79/106]
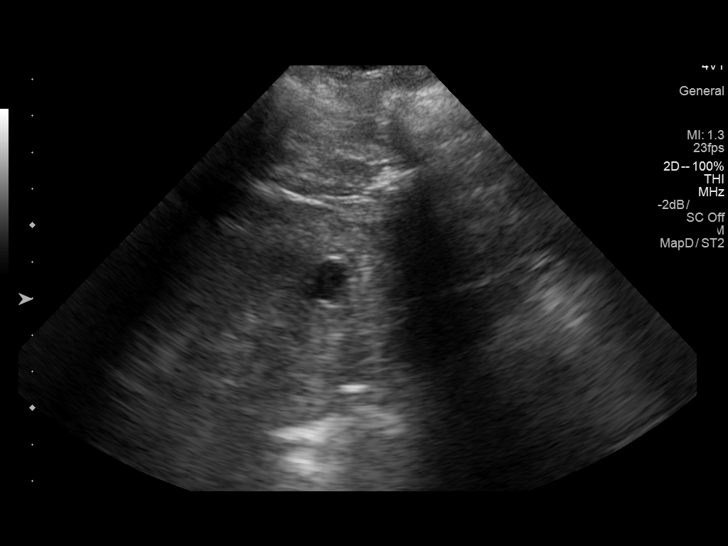
[im 88/106]
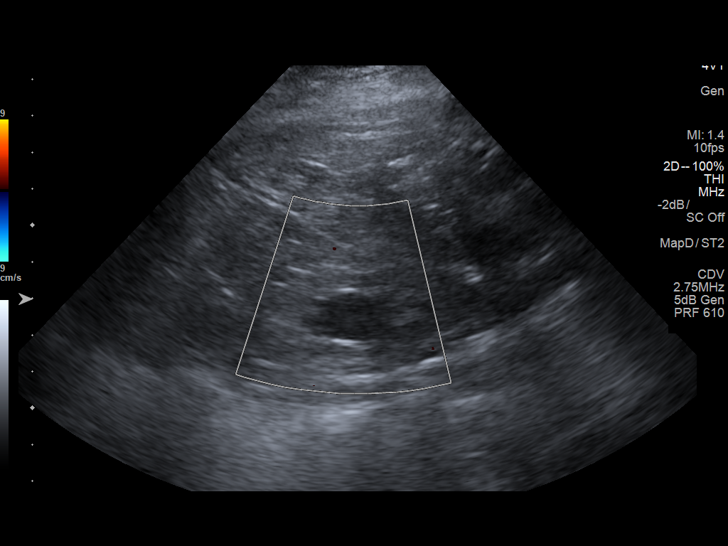
[im 97/106]
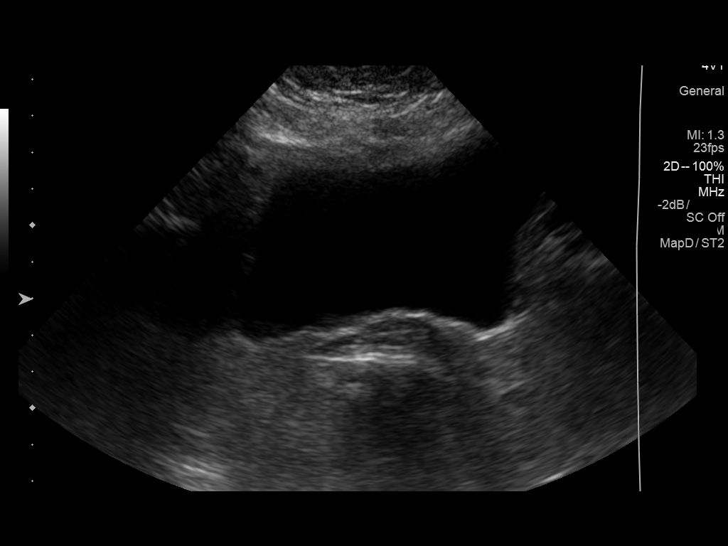
[im 106/106]
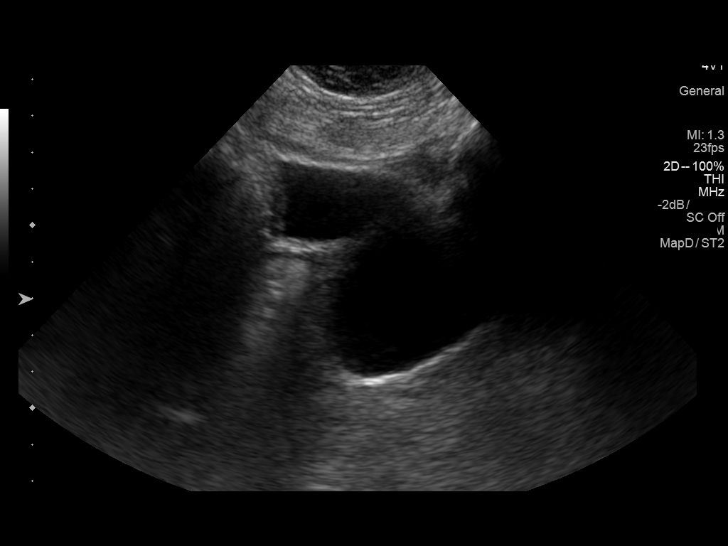

[14 of 25 positions shown; findings below may reference images not displayed]

FINDINGS: Right Kidney:

Length: 9.8 cm. Diffusely increased in echogenicity. No
hydronephrosis.

Left Kidney:

Length: 9.3 cm. Diffusely increased in echogenicity. No
hydronephrosis. Multiple cysts within the left kidney. Reference
cyst within the inferior pole measures 2.2 x 1.1 x 1.6 cm. Cyst
within the superior pole of the left kidney measures 2.6 x 1.6 x
cm.

Bladder:

Appears normal for degree of bladder distention.
IMPRESSION: No hydronephrosis.

Diffusely increased renal cortical echogenicity as can be seen with
chronic medical renal disease.
